# Patient Record
Sex: Female | Born: 1950 | Race: White | Hispanic: No | Marital: Married | Smoking: Never smoker
Health system: Southern US, Community
[De-identification: ages and names within clinical notes are randomized; demographics above are authoritative.]

## PROBLEM LIST (undated history)

## (undated) DIAGNOSIS — M5030 Other cervical disc degeneration, unspecified cervical region: Secondary | ICD-10-CM

## (undated) DIAGNOSIS — F419 Anxiety disorder, unspecified: Secondary | ICD-10-CM

## (undated) DIAGNOSIS — S0300XA Dislocation of jaw, unspecified side, initial encounter: Secondary | ICD-10-CM

## (undated) DIAGNOSIS — E7800 Pure hypercholesterolemia, unspecified: Secondary | ICD-10-CM

## (undated) DIAGNOSIS — I10 Essential (primary) hypertension: Secondary | ICD-10-CM

## (undated) DIAGNOSIS — M5136 Other intervertebral disc degeneration, lumbar region: Secondary | ICD-10-CM

## (undated) DIAGNOSIS — R51 Headache: Secondary | ICD-10-CM

## (undated) DIAGNOSIS — M51369 Other intervertebral disc degeneration, lumbar region without mention of lumbar back pain or lower extremity pain: Secondary | ICD-10-CM

## (undated) DIAGNOSIS — R519 Headache, unspecified: Secondary | ICD-10-CM

## (undated) DIAGNOSIS — M1990 Unspecified osteoarthritis, unspecified site: Secondary | ICD-10-CM

## (undated) DIAGNOSIS — F329 Major depressive disorder, single episode, unspecified: Secondary | ICD-10-CM

## (undated) DIAGNOSIS — M797 Fibromyalgia: Secondary | ICD-10-CM

## (undated) DIAGNOSIS — I499 Cardiac arrhythmia, unspecified: Secondary | ICD-10-CM

## (undated) DIAGNOSIS — F32A Depression, unspecified: Secondary | ICD-10-CM

## (undated) DIAGNOSIS — K219 Gastro-esophageal reflux disease without esophagitis: Secondary | ICD-10-CM

## (undated) HISTORY — PX: OTHER SURGICAL HISTORY: SHX169

## (undated) HISTORY — PX: BACK SURGERY: SHX140

## (undated) HISTORY — PX: TONSILLECTOMY: SUR1361

## (undated) HISTORY — PX: KNEE ARTHROSCOPY: SUR90

## (undated) HISTORY — PX: ROTATOR CUFF REPAIR: SHX139

## (undated) HISTORY — PX: EYE SURGERY: SHX253

## (undated) HISTORY — PX: BUNIONECTOMY: SHX129

## (undated) HISTORY — PX: TUBAL LIGATION: SHX77

---

## 1997-12-02 ENCOUNTER — Ambulatory Visit (HOSPITAL_COMMUNITY): Admission: RE | Admit: 1997-12-02 | Discharge: 1997-12-03 | Payer: Self-pay | Admitting: Orthopaedic Surgery

## 1997-12-05 ENCOUNTER — Inpatient Hospital Stay (HOSPITAL_COMMUNITY): Admission: RE | Admit: 1997-12-05 | Discharge: 1997-12-06 | Payer: Self-pay | Admitting: Orthopaedic Surgery

## 1999-03-08 ENCOUNTER — Other Ambulatory Visit: Admission: RE | Admit: 1999-03-08 | Discharge: 1999-03-08 | Payer: Self-pay | Admitting: Obstetrics & Gynecology

## 1999-06-09 ENCOUNTER — Ambulatory Visit (HOSPITAL_COMMUNITY): Admission: RE | Admit: 1999-06-09 | Discharge: 1999-06-09 | Payer: Self-pay | Admitting: Orthopedic Surgery

## 1999-08-24 ENCOUNTER — Encounter: Admission: RE | Admit: 1999-08-24 | Discharge: 1999-11-22 | Payer: Self-pay | Admitting: Anesthesiology

## 1999-10-12 ENCOUNTER — Encounter: Payer: Self-pay | Admitting: Family Medicine

## 1999-10-12 ENCOUNTER — Ambulatory Visit (HOSPITAL_COMMUNITY): Admission: RE | Admit: 1999-10-12 | Discharge: 1999-10-12 | Payer: Self-pay | Admitting: Family Medicine

## 1999-12-26 ENCOUNTER — Encounter: Payer: Self-pay | Admitting: *Deleted

## 1999-12-26 ENCOUNTER — Encounter: Admission: RE | Admit: 1999-12-26 | Discharge: 1999-12-26 | Payer: Self-pay | Admitting: *Deleted

## 2000-05-21 ENCOUNTER — Other Ambulatory Visit: Admission: RE | Admit: 2000-05-21 | Discharge: 2000-05-21 | Payer: Self-pay | Admitting: Obstetrics & Gynecology

## 2000-12-09 ENCOUNTER — Inpatient Hospital Stay (HOSPITAL_COMMUNITY): Admission: RE | Admit: 2000-12-09 | Discharge: 2000-12-13 | Payer: Self-pay | Admitting: Neurosurgery

## 2000-12-09 ENCOUNTER — Encounter: Payer: Self-pay | Admitting: Neurosurgery

## 2001-03-09 ENCOUNTER — Encounter: Payer: Self-pay | Admitting: Neurosurgery

## 2001-03-09 ENCOUNTER — Encounter: Admission: RE | Admit: 2001-03-09 | Discharge: 2001-03-09 | Payer: Self-pay | Admitting: Neurosurgery

## 2001-06-02 ENCOUNTER — Other Ambulatory Visit: Admission: RE | Admit: 2001-06-02 | Discharge: 2001-06-02 | Payer: Self-pay | Admitting: Obstetrics & Gynecology

## 2001-06-16 ENCOUNTER — Encounter: Payer: Self-pay | Admitting: Neurosurgery

## 2001-06-16 ENCOUNTER — Encounter: Admission: RE | Admit: 2001-06-16 | Discharge: 2001-06-16 | Payer: Self-pay | Admitting: Neurosurgery

## 2002-07-26 ENCOUNTER — Encounter: Admission: RE | Admit: 2002-07-26 | Discharge: 2002-07-26 | Payer: Self-pay | Admitting: *Deleted

## 2002-07-26 ENCOUNTER — Encounter: Payer: Self-pay | Admitting: *Deleted

## 2002-08-10 ENCOUNTER — Other Ambulatory Visit: Admission: RE | Admit: 2002-08-10 | Discharge: 2002-08-10 | Payer: Self-pay | Admitting: Obstetrics & Gynecology

## 2002-12-29 ENCOUNTER — Ambulatory Visit (HOSPITAL_COMMUNITY): Admission: RE | Admit: 2002-12-29 | Discharge: 2002-12-29 | Payer: Self-pay | Admitting: Gastroenterology

## 2003-10-11 ENCOUNTER — Other Ambulatory Visit: Admission: RE | Admit: 2003-10-11 | Discharge: 2003-10-11 | Payer: Self-pay | Admitting: Obstetrics & Gynecology

## 2004-11-08 ENCOUNTER — Other Ambulatory Visit: Admission: RE | Admit: 2004-11-08 | Discharge: 2004-11-08 | Payer: Self-pay | Admitting: Obstetrics & Gynecology

## 2005-10-03 ENCOUNTER — Encounter: Admission: RE | Admit: 2005-10-03 | Discharge: 2005-10-03 | Payer: Self-pay | Admitting: Obstetrics & Gynecology

## 2007-10-05 ENCOUNTER — Encounter: Admission: RE | Admit: 2007-10-05 | Discharge: 2007-10-05 | Payer: Self-pay | Admitting: Family Medicine

## 2008-06-29 ENCOUNTER — Encounter: Admission: RE | Admit: 2008-06-29 | Discharge: 2008-06-29 | Payer: Self-pay | Admitting: Otolaryngology

## 2008-10-10 ENCOUNTER — Ambulatory Visit (HOSPITAL_COMMUNITY): Admission: RE | Admit: 2008-10-10 | Discharge: 2008-10-11 | Payer: Self-pay | Admitting: Orthopaedic Surgery

## 2009-09-26 ENCOUNTER — Emergency Department (HOSPITAL_COMMUNITY): Admission: EM | Admit: 2009-09-26 | Discharge: 2009-09-26 | Payer: Self-pay | Admitting: Family Medicine

## 2009-10-05 ENCOUNTER — Encounter: Admission: RE | Admit: 2009-10-05 | Discharge: 2009-10-05 | Payer: Self-pay | Admitting: Family Medicine

## 2009-10-10 ENCOUNTER — Ambulatory Visit (HOSPITAL_COMMUNITY): Admission: RE | Admit: 2009-10-10 | Discharge: 2009-10-11 | Payer: Self-pay | Admitting: Neurosurgery

## 2010-07-09 LAB — BASIC METABOLIC PANEL
BUN: 8 mg/dL (ref 6–23)
CO2: 29 mEq/L (ref 19–32)
Chloride: 102 mEq/L (ref 96–112)
Creatinine, Ser: 0.94 mg/dL (ref 0.4–1.2)
GFR calc Af Amer: >60 mL/min (ref >60)
Potassium: 3.8 mEq/L (ref 3.5–5.1)
Sodium: 139 mEq/L (ref 135–145)

## 2010-07-09 LAB — SURGICAL PCR SCREEN
MRSA, PCR: NEGATIVE
Staphylococcus aureus: NEGATIVE

## 2010-07-09 LAB — CBC
Hemoglobin: 13.9 g/dL (ref 12.0–15.0)
Platelets: 272 10*3/uL (ref 150–400)
RBC: 4.42 MIL/uL (ref 3.87–5.11)

## 2010-07-30 LAB — CBC
HCT: 40.5 % (ref 36.0–46.0)
Hemoglobin: 13.6 g/dL (ref 12.0–15.0)
Platelets: 253 10*3/uL (ref 150–400)
RBC: 4.34 MIL/uL (ref 3.87–5.11)
WBC: 7.3 10*3/uL (ref 4.0–10.5)

## 2010-07-30 LAB — COMPREHENSIVE METABOLIC PANEL
AST: 32 U/L (ref 0–37)
Alkaline Phosphatase: 69 U/L (ref 39–117)
Creatinine, Ser: 0.78 mg/dL (ref 0.4–1.2)
Potassium: 4.1 mEq/L (ref 3.5–5.1)
Total Bilirubin: 0.6 mg/dL (ref 0.3–1.2)

## 2010-07-30 LAB — URINALYSIS, ROUTINE W REFLEX MICROSCOPIC
Bilirubin Urine: NEGATIVE
Ketones, ur: NEGATIVE mg/dL
Specific Gravity, Urine: 1.007 (ref 1.005–1.030)

## 2010-09-04 NOTE — Op Note (Signed)
NAMEMONAY, HOULTON             ACCOUNT NO.:  [id number]   MEDICAL RECORD NO.:  [id number]          PATIENT TYPE:  OIB   LOCATION:  5012                         FACILITY:  [facility]   PHYSICIAN:  Mark C. Ophelia Charter, M.D.    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   PROCEDURE:  C4-5 anterior cervical diskectomy and fusion, allograft, and  plate.   SURGEON:  Mark C. Ophelia Charter, MD   ASSISTANT:  Wende Neighbors, PA-C   ANESTHESIA:  GOT.   ESTIMATED BLOOD LOSS:  Minimal.   DRAINS:  One Hemovac neck.   BRIEF HISTORY:  This is a 60 year old female had a Cloward fusion at C5-  6 years ago which healed nicely.  She done well until recently.  She had  progressive increased neck pain, shoulder pain more in the left than  right with numbness in her shoulder.  The pain was progressed and MRI  scan shows significant spondylosis with biforaminal stenosis, worse in  the left than right, and central stenosis without cord abnormality.  The  patient was having no long-track signs.   PROCEDURE:  After induction of general anesthesia, orotracheal  intubation, head halter traction, arms tucked to the side with yellow  pads, neck was prepped with DuraPrep.  The area was squared with towels,  sterile skin marker was used in the old incision and Betadine Vi-drape  was applied.  Sterile Mayo stand at the head, thyroid sheets and drapes.  Old incision was opened after time-out checklist was completed.  Calf  pumpers and the patient warmer and Ancef prophylaxis were used.  Incision was made using the old incision.  Platysma was elevated,  cephalad and then dissection down to the prominent spur at C4-5.  Short  25 needle and drape, C-arm was brought in and confirmed that this was at  the C4-5 level above the old solid fusion.  Self-retaining Cloward  retractors were placed.  Teeth blades right and left smooth blades up  and down, diskectomy was performed with the scalpel, pituitary operative  microscope was draped and brought in and drilling with 4-mm bur  progressively back to the posterior cortex.  There was 0.5-mm gap  between the C4-C5 and spurs were present that were significant in both  right and left.  These had to be removed with microdissection using 1-mm  Kerrison and the Karlin curettes, 4-0.  Spurs were picked up, removed  and posterior longitudinal ligament was taken down until the dura was  well visualized right and left.  Uncovertebral spurs were removed and  gutters were stripped with the Cloward curettes.  Sizer showed 6-mm  graft with excellent fit.  Rasp was used.  Graft was rehydrated with  saline, marked at the midline and then impacted into good position.  It  was countersunk 1-2 mm, was stable.  A 16-mm VueLock Biomet EBI plate  was selected, 14-mm screws were used.  Position was checked initially  with the plate holder and then after placement of one screw and then  readjustment and placement of all four screws on final tube pictures.  Operative wound was dry.  Self-retaining  retractor was removed.  Hemovac was placed with in-and-out technique in  line with skin incision, 3-0 Vicryl in the platysma, 4-0 Vicryl  subcuticular closure.  Tincture of Benzoin, Steri-Strips, postop  dressing, soft cervical collar, and transferred to recovery room in  stable condition.  Instrument and needle count was correct.      Mark C. Ophelia Charter, M.D.  Electronically Signed     MCY/MEDQ  D:  10/10/2008  T:  10/11/2008  Job:  [id number]
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
## 2010-09-07 NOTE — Discharge Summary (Signed)
[place]. [facility]  Patient:    DEVONA, HOLMES Visit Number: [id number] MRN: [id number]          Service Type: SUR Location: 3000 3011 [id number] Attending Physician:  Colon Branch Dictated by:   Clydene Fake, M.D. Adm. Date:  12/09/2000 Disc. Date: 12/13/2000                             Discharge Summary  ADMISSION DIAGNOSES:  Degenerative disk disease, lateral recess stenosis, and herniated nucleus pulposus at L5-S1.  DISCHARGE DIAGNOSES:  Degenerative disk disease, lateral recess stenosis, and herniated nucleus pulposus at L5-S1.  PROCEDURES:  L5-S1 posterior lumbar interbody fusion with Brantigan interbody cages, pedicle screw fixation, decompressive laminectomy, autograft of same incision, and posterolateral fusion of L5-S1.  REASON FOR ADMISSION:  The patient is a 60 year old woman who has had severe back pain traditionally with more and more pain going down the left leg.  This has been going on for over six to eight months and pain worsening.  She had a discogram which recorded pain at L5-S1.  MRIs and x-rays showed severe degenerative disk disease at L5-S1 with a left-sided disk herniation and some lateral recess stenosis.  The patient was brought in for decompression and fusion.  HOSPITAL COURSE:  The patient was admitted on the day of surgery and underwent the procedure without complications.  Postoperatively the patient was transferred to the recovery room and then to the floor.  There she had some rash and itching to morphine which was changed to a Dilaudid PCA.  The next morning, she was little sedated with some slurred speech and decreased the Dilaudid PCA to a lower dose and that improved her mental status.  She was moving all extremities and did not have much leg pain.  She started to increase her activity on the first postoperative day.  PT and OT were evaluated for evaluation.  She had a little problem with nausea and  dizziness on the first day.  She was trying to get out of bed and essentially was only sitting up on that day.  By the next day, the nausea started improving, and we did work on increasing her activity.  On December 12, 2000, we got rid of the PCA and started her on oral pain medications.  She still had not had a bowel movement.  She had some abdominal distention, but overall was improving and was ambulating more.  She had decreased back pain and no leg pain.  She continued to improve.  On December 13, 2000, she will be discharged home in stable condition.  She was eating well and ambulating.  She has had a bowel movement.  The incision was clean, dry, and intact.  ACTIVITY:  She can be up with the brace on.  No strenuous activity.  WOUND CARE:  Keep the incision dry for five days.  DIET:  As tolerated.  FOLLOW-UP:  In three weeks in my office.  DISCHARGE MEDICATIONS:  The same as prehospitalization and Atenolol, Paxil, and Percocet p.r.n. for pain (51 were given), and Flexeril p.r.n. spasms (she already has that at home), but no nonsteroidal anti-inflammatories. Dictated by:   Clydene Fake, M.D. Attending Physician:  Colon Branch DD:  12/13/00 TD:  12/15/00 Job: [id number]

## 2010-09-07 NOTE — Procedures (Signed)
[facility]  Patient:    Stephanie Kim, Stephanie Kim                    MRN: [id number] Proc. Date: 09/14/99 Adm. Date:  [id number] Attending:  Thyra Breed CC:         Jearld Adjutant, M.D.                           Procedure Report  PROCEDURE:  Cervical epidural steroid injection.  DIAGNOSIS:  Cervical spondylosis.  INTERVAL HISTORY:  The patient has had no improvements after her injection into her lumbar region.  She continues to have a lot of neck discomfort radiating out to the right upper extremity.  Her lower back is no better.  Her medications are unchanged.  PHYSICAL EXAMINATION:  VITAL SIGNS:  Blood pressure 136/77, heart rate 52, respiratory rate 12, O2 saturation 98%, pain level is 8 out of 10, and temperature is 97.6.  MUSCULOSKELETAL/NEUROLOGIC:  Neck demonstrates good range of motion with pain on rotation in either direction.  Deep tendon reflexes in the upper extremities were 1-2+ and symmetric.  Motor was 5.5.  IMPRESSION: 1. Neck pain with history of cervical strain and underlying cervical    spondylosis. 2. Lumbar degenerative disk disease. 3. Other medical problems per Dr. Lattie Corns.  DISPOSITION:  I discussed the potential risk of the cervical epidural steroid injection in detail with the patient.  She wishes to proceed.  DESCRIPTION OF PROCEDURE:  After informed consent was obtained, the patient was placed in the right lateral decubitus position and monitored.  Her neck was prepped with Betadine x 3.  A skin wheal was raised at the C7-T1 interspace with 1% lidocaine.  A 20 gauge Tuohy needle was introduced into the cervical space and loss of resistance to preservative-free normal saline, a depth of 4 cm.  There was no CSF nor blood.  Medrol 40 mg in 3 cc of preservative-free normal saline was gently injected.  The needle was flushed with preservative-free normal saline and removed intact.  POSTPROCEDURE CONDITION:   Stable.  DISCHARGE INSTRUCTIONS: 1. Resume previous diet. 2. Limitation on activities per instruction sheet. 3. Continue current medications. 4. Follow up with me in six weeks.  The patient was encouraged to consider chiropractic or acupuncture therapy. DD:  09/14/99 TD:  09/19/99 Job: [id number]

## 2010-09-07 NOTE — Op Note (Signed)
[place]. [facility]  Patient:    Stephanie Kim, Stephanie Kim Visit Number: [id number] MRN: [id number]          Service Type: SUR Location: 3000 3011 [id number] Attending Physician:  Colon Branch Proc. Date: 12/09/00 Adm. Date:  12/09/2000                             Operative Report  PREOPERATIVE DIAGNOSES:  Degenerative disk disease L5-S1 with left-sided herniated nucleus pulposus and lateral stenosis.  POSTOPERATIVE DIAGNOSES:  Degenerative disk disease L5-S1 with left-sided herniated nucleus pulposus and lateral stenosis.  PROCEDURE:  L5-S1 posterior lumbar interbody fusion with ______ interbody cages at L5-S1, pedicle screw fixation L5-S1, decompressive laminectomy for stenosis, autograft same incision, posterior lateral fusion L5-1.  SURGEON:  Clydene Fake, M.D.  ASSISTANT:  Izell . Elesa Hacker, M.D.  ANESTHESIA:  General endotracheal tube anesthesia.  ESTIMATED BLOOD LOSS:  Less than 250 cc.  BLOOD GIVEN:  None.  DRAINS:  None.  COMPLICATIONS:  None.  REASON FOR CONSULTATION:  The patient is a 60 year old woman who has had severe back pain recently, and more and more left leg pain.  She has had a discogram with this concordant pain at L5-S1.  MRIs and x-rays that showed severe degenerative disk disease at the L5-S1 level with left-sided disk herniation and some lateral stenosis at that level.  Patient brought in for decompression and fusion.  PROCEDURE IN DETAIL:  The patient was brought to the operating room and general anesthesia was induced.  The patient was placed in a prone position and a Wilson Frame with all pressure points padded.  Incision was made in the lower lumbosacral spine.  Incision taken down to fascia and hemostasis obtained with Bovie cauterization.  The fascia was incised over the spinous process of L4-5 and S1.  Subperiosteal dissection was then done over the L4-5 spinous processes to the facets bilaterally.  Fluoroscopic  imaging was used to confirm positioning.  We dissected laterally over the 4-5 facet finding the 5 transverse process and out lateral to the 5-1 facet.  I did this bilaterally. Hemostasis was obtained with Bovie cauterization.  At this point decompressive laminectomy for the lateral stenosis was done at the L5-S1 level.  It was a semi-bilateral along with medial fasciectomies. All the bone was removed, cleaned, and chopped into small pieces and saved for use later in the case.  A high speed drill was used to assist in laminectomy and fasciectomy.  Epidural hemostasis was obtained with bipolar cauterization and disk space was found.  A large disk bulge and subligamentous disk herniation was seen, bilaterally worse on the left.  The disk space was incised and diskectomy performed with pituitary rongeurs.  We then used interbody distraction to distract the 5-1 interspace up to 9 mm. We then used the shavers and then broached to prepare the 5-1 disk space for the ______ cages bilaterally.  We used fluoroscopic imaging during the use of the broach and for the distraction.  A 9 x 9 x 21 mm cages were then packed with autograft and ______  bone mixture.  The same bone mixture was then placed anterior into the interspace and then a right ______ cage tapped into place while the interbody distraction was on the left side.  This distraction was then removed.  The interspace was packed with bone and then the left-sided cage was tapped into place.  Again, this was done with fluoroscopic  imaging to confirm the position of the cages.  It was irrigated with antibiotic solution.  We then found the entry point to the L5-S1 pedicles.  High-speed drill was used to decorticate the bone and then a pedicle probe was placed down into the pedicles using fluoroscopic imaging. After the probe was removed the hole was tapped and the pedicle screws were placed.  At L5 a 6 1/4 x 40 mm long ______ screws were used and  an S1 7 mm wide by 35 mm long ______ screws were used.  A 45 mm ______ was then placed into the tops of the pedicle screws, one on each side, and the locking nuts snapped on top of it.  We then tightened the tightening nuts on L5 and then with compression to the 5-1 interspace we tightened the S1 tightening nut, first on the left, then on the right.  We then checked each screw to make sure they were tightened down.  The wound was irrigated with antibiotic solution and then the rest of our allograft bone mixture was placed out in the posterior lateral gutter bilaterally for a posterolateral fusion.  Traction was removed and hemostasis was obtained with Bovie cauterization. Gelfoam was placed over the dura, and the paraspinous muscles and then the fascia were closed with 0 Vicryl interrupted sutures.  The subcutaneous tissue was then closed with 2-0 and 3-0 Vicryl interrupted sutures and the skin closed with Steri-Strips.  Dressing was placed. The patient was placed back in supine position and awakened from anesthesia and transferred to recovery room. Attending Physician:  Colon Branch DD:  12/09/00 TD:  12/10/00 Job: [id number]
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
## 2010-09-07 NOTE — Op Note (Signed)
   NAME:  Stephanie Kim, Stephanie Kim                       ACCOUNT NO.:  [id number]   MEDICAL RECORD NO.:  [id number]                   PATIENT TYPE:  AMB   LOCATION:  ENDO                                 FACILITY:  [facility]   PHYSICIAN:  Graylin Shiver, M.D.                DATE OF BIRTH:  11/16/1950   DATE OF PROCEDURE:  12/29/2002  DATE OF DISCHARGE:                                 OPERATIVE REPORT   PROCEDURE PERFORMED:  Colonoscopy.   INDICATIONS FOR PROCEDURE:  Screening.  Informed consent was obtained after  explanation of the risks of bleeding, infection, and perforation.   PREMEDICATIONS:  Fentanyl 60 mcg  IV, Versed 6 mg IV.   DESCRIPTION OF PROCEDURE:  With the patient in the left lateral decubitus  position, a rectal exam was performed and no masses were felt.  The Olympus  colonoscope was inserted into the rectum and advanced around the colon to  the cecum.  Cecal landmarks were identified.  The cecum and ascending colon  were normal.  The transverse colon normal.  The descending colon, sigmoid  and rectum were normal.  The patient tolerated the procedure well without  complications.   IMPRESSION:  Normal colonoscopy to the cecum.                                               Graylin Shiver, M.D.    Germain Osgood  D:  12/29/2002  T:  12/29/2002  Job:  [id number]   cc:   Tama Headings. Marina Goodell, M.D.  [address]  [place]  [place] [address]  Fax: [phone]

## 2011-09-24 ENCOUNTER — Other Ambulatory Visit: Payer: Self-pay | Admitting: Family Medicine

## 2011-09-24 DIAGNOSIS — M810 Age-related osteoporosis without current pathological fracture: Secondary | ICD-10-CM

## 2011-10-18 IMAGING — CR DG NECK SOFT TISSUE
1 series · 1 of 1 positions shown · non-contrast
Comparison: Intraoperative radiographs 10/10/2008.

CLINICAL DATA: Fell lodged in throat.

NECK SOFT TISSUES - 1+ VIEW

[view not recorded]
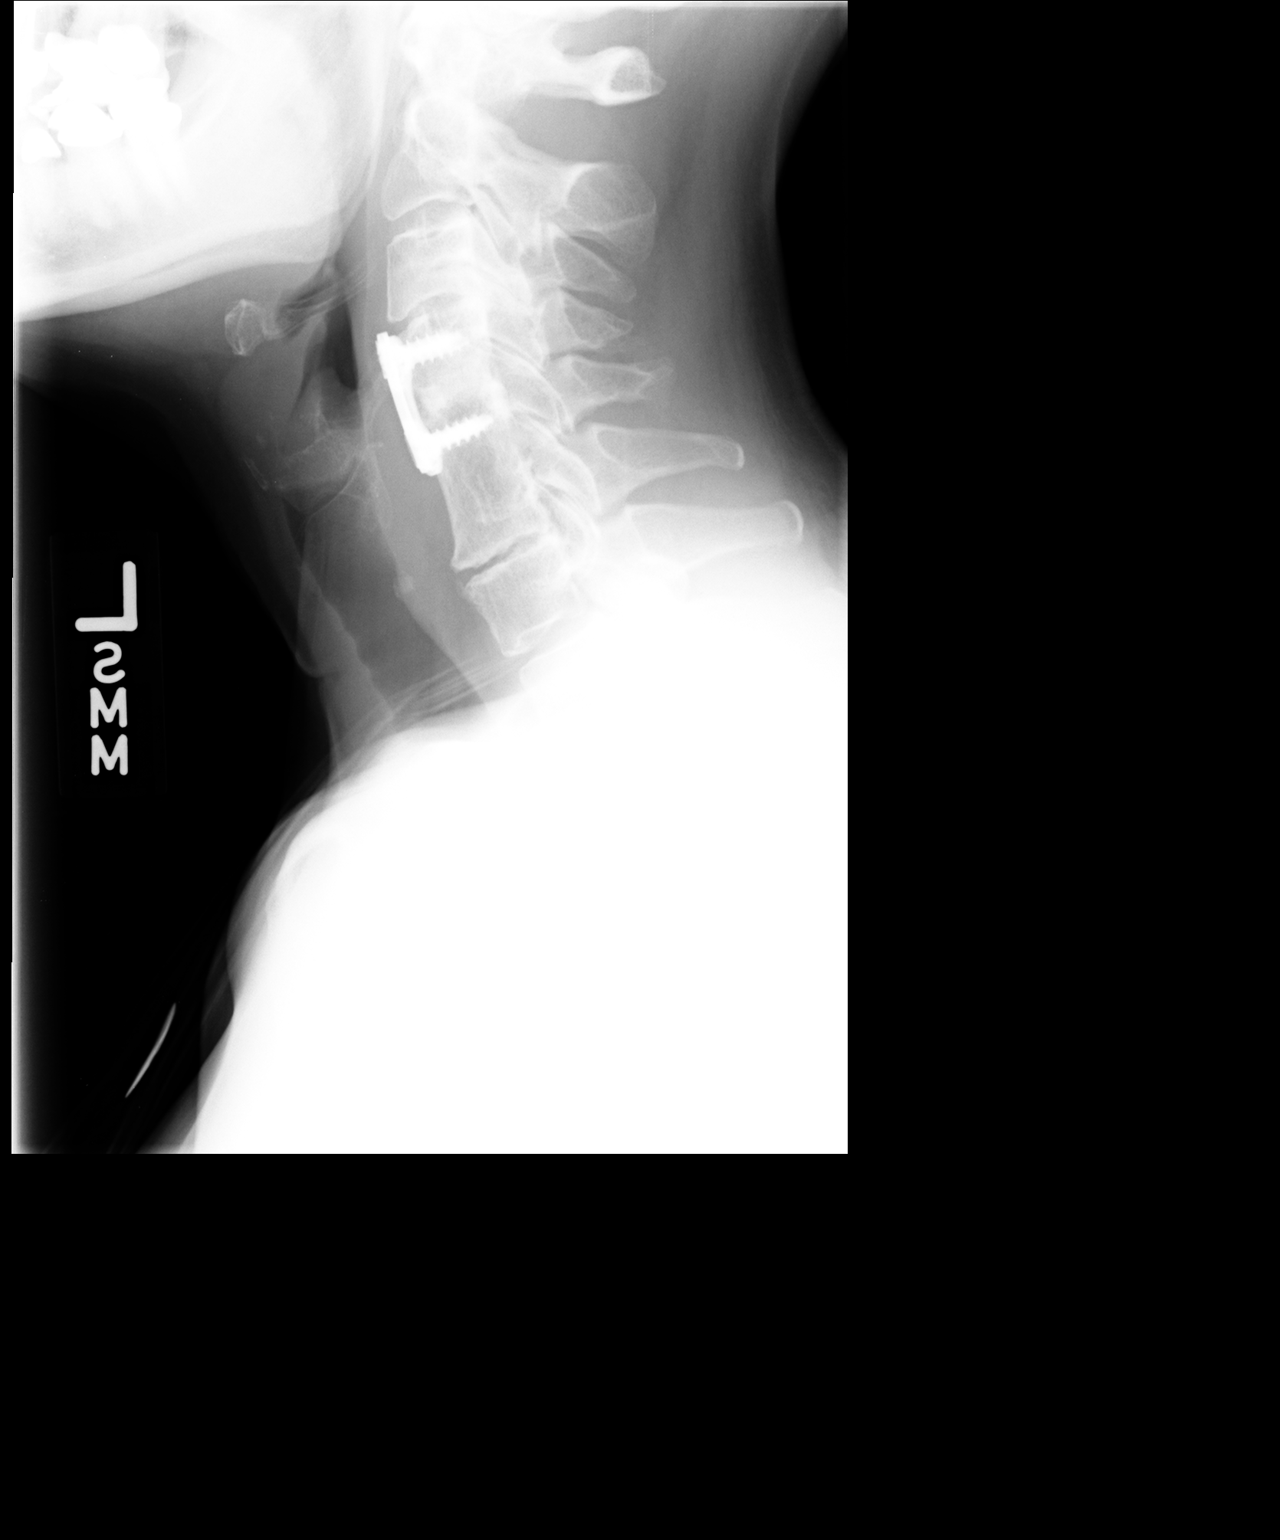

[1 of 1 positions shown; findings below may reference images not displayed]

FINDINGS: Patient is status post cervical fusion from C4-C6.
Anterior plate and screws at C4-C5 are intact.  There is
spondylosis at C6-C7.  The prevertebral soft tissues appear normal.
The epiglottis appears normal.  No foreign bodies are identified
within the airway or upper esophagus.
IMPRESSION: No demonstrated foreign bodies in the neck.  Prior cervical fusion.

## 2011-10-23 ENCOUNTER — Ambulatory Visit
Admission: RE | Admit: 2011-10-23 | Discharge: 2011-10-23 | Disposition: A | Discharge status: Home / Self Care | Payer: Self-pay | Source: Ambulatory Visit | Attending: Family Medicine | Admitting: Family Medicine

## 2011-10-23 DIAGNOSIS — M810 Age-related osteoporosis without current pathological fracture: Secondary | ICD-10-CM

## 2011-10-23 DIAGNOSIS — M8580 Other specified disorders of bone density and structure, unspecified site: Secondary | ICD-10-CM

## 2011-11-06 ENCOUNTER — Other Ambulatory Visit: Payer: Self-pay | Admitting: Neurosurgery

## 2011-11-06 DIAGNOSIS — M48061 Spinal stenosis, lumbar region without neurogenic claudication: Secondary | ICD-10-CM

## 2011-11-06 DIAGNOSIS — IMO0002 Reserved for concepts with insufficient information to code with codable children: Secondary | ICD-10-CM

## 2011-11-06 DIAGNOSIS — M545 Low back pain: Secondary | ICD-10-CM

## 2011-11-12 ENCOUNTER — Ambulatory Visit
Admission: RE | Admit: 2011-11-12 | Discharge: 2011-11-12 | Disposition: A | Discharge status: Home / Self Care | Payer: BC Managed Care – PPO | Source: Ambulatory Visit | Attending: Neurosurgery | Admitting: Neurosurgery

## 2011-11-12 DIAGNOSIS — M545 Low back pain: Secondary | ICD-10-CM

## 2011-11-12 DIAGNOSIS — IMO0002 Reserved for concepts with insufficient information to code with codable children: Secondary | ICD-10-CM

## 2011-11-12 DIAGNOSIS — M48061 Spinal stenosis, lumbar region without neurogenic claudication: Secondary | ICD-10-CM

## 2011-11-12 MED ORDER — GADOBENATE DIMEGLUMINE 529 MG/ML IV SOLN
11.0000 mL | Freq: Once | INTRAVENOUS | Status: AC | PRN
  Administered 2011-11-12: 11 mL via INTRAVENOUS

## 2013-10-26 ENCOUNTER — Other Ambulatory Visit: Payer: Self-pay | Admitting: Physician Assistant

## 2013-10-26 DIAGNOSIS — M810 Age-related osteoporosis without current pathological fracture: Secondary | ICD-10-CM

## 2013-10-28 ENCOUNTER — Other Ambulatory Visit: Payer: Self-pay | Admitting: Physician Assistant

## 2013-10-28 ENCOUNTER — Ambulatory Visit
Admission: RE | Admit: 2013-10-28 | Discharge: 2013-10-28 | Disposition: A | Discharge status: Home / Self Care | Payer: BC Managed Care – PPO | Source: Ambulatory Visit | Attending: Physician Assistant | Admitting: Physician Assistant

## 2013-10-28 DIAGNOSIS — M8580 Other specified disorders of bone density and structure, unspecified site: Secondary | ICD-10-CM

## 2013-10-28 DIAGNOSIS — M810 Age-related osteoporosis without current pathological fracture: Secondary | ICD-10-CM

## 2014-09-22 ENCOUNTER — Other Ambulatory Visit: Payer: Self-pay | Admitting: Neurosurgery

## 2014-10-18 ENCOUNTER — Encounter (HOSPITAL_COMMUNITY)
Admission: RE | Admit: 2014-10-18 | Discharge: 2014-10-18 | Disposition: A | Discharge status: Home / Self Care | Payer: BC Managed Care – PPO | Source: Ambulatory Visit | Attending: Neurosurgery | Admitting: Neurosurgery

## 2014-10-18 ENCOUNTER — Encounter (HOSPITAL_COMMUNITY): Payer: Self-pay

## 2014-10-18 DIAGNOSIS — Z01810 Encounter for preprocedural cardiovascular examination: Secondary | ICD-10-CM | POA: Insufficient documentation

## 2014-10-18 DIAGNOSIS — R002 Palpitations: Secondary | ICD-10-CM | POA: Diagnosis not present

## 2014-10-18 DIAGNOSIS — M4806 Spinal stenosis, lumbar region: Secondary | ICD-10-CM | POA: Diagnosis not present

## 2014-10-18 DIAGNOSIS — Z01812 Encounter for preprocedural laboratory examination: Secondary | ICD-10-CM | POA: Diagnosis present

## 2014-10-18 DIAGNOSIS — M5416 Radiculopathy, lumbar region: Secondary | ICD-10-CM | POA: Diagnosis not present

## 2014-10-18 DIAGNOSIS — Z0183 Encounter for blood typing: Secondary | ICD-10-CM | POA: Diagnosis not present

## 2014-10-18 DIAGNOSIS — M4316 Spondylolisthesis, lumbar region: Secondary | ICD-10-CM | POA: Insufficient documentation

## 2014-10-18 HISTORY — DX: Essential (primary) hypertension: I10

## 2014-10-18 HISTORY — DX: Depression, unspecified: F32.A

## 2014-10-18 HISTORY — DX: Anxiety disorder, unspecified: F41.9

## 2014-10-18 HISTORY — DX: Headache, unspecified: R51.9

## 2014-10-18 HISTORY — DX: Dislocation of jaw, unspecified side, initial encounter: S03.00XA

## 2014-10-18 HISTORY — DX: Cardiac arrhythmia, unspecified: I49.9

## 2014-10-18 HISTORY — DX: Pure hypercholesterolemia, unspecified: E78.00

## 2014-10-18 HISTORY — DX: Fibromyalgia: M79.7

## 2014-10-18 HISTORY — DX: Major depressive disorder, single episode, unspecified: F32.9

## 2014-10-18 HISTORY — DX: Headache: R51

## 2014-10-18 HISTORY — DX: Unspecified osteoarthritis, unspecified site: M19.90

## 2014-10-18 LAB — ABO/RH: ABO/RH(D): O POS

## 2014-10-18 LAB — CBC
HEMATOCRIT: 44 % (ref 36.0–46.0)
HEMOGLOBIN: 14.6 g/dL (ref 12.0–15.0)
MCH: 31 pg (ref 26.0–34.0)
MCHC: 33.2 g/dL (ref 30.0–36.0)
MCV: 93.4 fL (ref 78.0–100.0)
Platelets: 308 10*3/uL (ref 150–400)
RBC: 4.71 MIL/uL (ref 3.87–5.11)
RDW: 12.7 % (ref 11.5–15.5)
WBC: 9.4 10*3/uL (ref 4.0–10.5)

## 2014-10-18 LAB — BASIC METABOLIC PANEL
Anion gap: 10 (ref 5–15)
BUN: 14 mg/dL (ref 6–20)
CO2: 26 mmol/L (ref 22–32)
CREATININE: 0.93 mg/dL (ref 0.44–1.00)
Calcium: 9.4 mg/dL (ref 8.9–10.3)
Chloride: 101 mmol/L (ref 101–111)
GFR calc Af Amer: >60 mL/min (ref >60)
GFR calc non Af Amer: >60 mL/min (ref >60)
GLUCOSE: 89 mg/dL (ref 65–99)
Potassium: 4.2 mmol/L (ref 3.5–5.1)
Sodium: 137 mmol/L (ref 135–145)

## 2014-10-18 LAB — TYPE AND SCREEN
ABO/RH(D): O POS
ANTIBODY SCREEN: NEGATIVE

## 2014-10-18 LAB — SURGICAL PCR SCREEN
MRSA, PCR: NEGATIVE
Staphylococcus aureus: NEGATIVE

## 2014-10-18 NOTE — Pre-Procedure Instructions (Signed)
Stephanie Kim  10/18/2014      [facility] [id number] - [place],  - [address] [facility] [address] [place] [id number] Phone: [phone] Fax: [phone]    Your procedure is scheduled on July 5th, Tuesday   [facility] Admitting at 5:30 AM  Call this number if you have problems the morning of surgery:  [phone]   Remember:  Do not eat food or drink liquids after midnight Monday.  Take these medicines the morning of surgery with A SIP OF WATER : Wellbutrin, metoprolol, zoloft.  Please use your nasal spray the morning of surgery.   Do not wear jewelry, make-up or nail polish.  Do not wear lotions, powders, or perfumes.  You may NOT wear deodorant the day of surgery.  Do not shave underarms & legs 48 hours prior to surgery.     Do not bring valuables to the hospital.  [facility] is not responsible for any belongings or valuables. Contacts, dentures or bridgework may not be worn into surgery.  Leave your suitcase in the car.  After surgery it may be brought to your room. For patients admitted to the hospital, discharge time will be determined by your treatment team.  Name and phone number of your driver:     Special instructions:  "Preparing for Surgery" instruction sheet.  Please read over the following fact sheets that you were given. Pain Booklet, Blood Transfusion Information, MRSA Information and Surgical Site Infection Prevention

## 2014-10-18 NOTE — Progress Notes (Signed)
PCP is Dr. Cyndia BentBadger, LOV 'acouple of months ago'.  Also see Huntley DecSara C-Spencer PA @ [facility]  [phone]. States she does have 'some palpitations--was put on Toprol for it.  No cardiac tests were done.  She denies any chest discomfort or problems at this time. Wants clarification of wording on consent.  She says that Lumbar 6-7 should be included.  I have called the office and left VM for Jessica Hanks.  (11:57 on Tuesday)

## 2014-10-24 MED ORDER — CEFAZOLIN SODIUM-DEXTROSE 2-3 GM-% IV SOLR
2.0000 g | INTRAVENOUS | Status: AC
  Administered 2014-10-25: 2 g via INTRAVENOUS
  Filled 2014-10-24: qty 50

## 2014-10-24 MED ORDER — CEFAZOLIN SODIUM-DEXTROSE 2-3 GM-% IV SOLR: 2.0000 g | INTRAVENOUS | Status: DC

## 2014-10-25 ENCOUNTER — Inpatient Hospital Stay (HOSPITAL_COMMUNITY): Payer: BC Managed Care – PPO

## 2014-10-25 ENCOUNTER — Encounter (HOSPITAL_COMMUNITY): Payer: Self-pay | Admitting: *Deleted

## 2014-10-25 ENCOUNTER — Inpatient Hospital Stay (HOSPITAL_COMMUNITY): Payer: BC Managed Care – PPO | Admitting: Certified Registered Nurse Anesthetist

## 2014-10-25 ENCOUNTER — Inpatient Hospital Stay (HOSPITAL_COMMUNITY)
Admission: RE | Admit: 2014-10-25 | Discharge: 2014-10-28 | DRG: 460 | Disposition: A | Discharge status: Home Health | Payer: BC Managed Care – PPO | Source: Ambulatory Visit | Attending: Neurosurgery | Admitting: Neurosurgery

## 2014-10-25 ENCOUNTER — Encounter (HOSPITAL_COMMUNITY)
Admission: RE | Disposition: A | Discharge status: Home Health | Payer: BC Managed Care – PPO | Source: Ambulatory Visit | Attending: Neurosurgery

## 2014-10-25 DIAGNOSIS — M5416 Radiculopathy, lumbar region: Secondary | ICD-10-CM | POA: Diagnosis present

## 2014-10-25 DIAGNOSIS — Z886 Allergy status to analgesic agent status: Secondary | ICD-10-CM

## 2014-10-25 DIAGNOSIS — F329 Major depressive disorder, single episode, unspecified: Secondary | ICD-10-CM | POA: Diagnosis present

## 2014-10-25 DIAGNOSIS — M4316 Spondylolisthesis, lumbar region: Secondary | ICD-10-CM | POA: Diagnosis present

## 2014-10-25 DIAGNOSIS — M4806 Spinal stenosis, lumbar region: Principal | ICD-10-CM | POA: Diagnosis present

## 2014-10-25 DIAGNOSIS — I10 Essential (primary) hypertension: Secondary | ICD-10-CM | POA: Diagnosis present

## 2014-10-25 DIAGNOSIS — Z419 Encounter for procedure for purposes other than remedying health state, unspecified: Secondary | ICD-10-CM

## 2014-10-25 DIAGNOSIS — M545 Low back pain: Secondary | ICD-10-CM | POA: Diagnosis present

## 2014-10-25 DIAGNOSIS — M129 Arthropathy, unspecified: Secondary | ICD-10-CM | POA: Diagnosis present

## 2014-10-25 SURGERY — POSTERIOR LUMBAR FUSION 2 WITH HARDWARE REMOVAL
Anesthesia: General | Site: Back

## 2014-10-25 MED ORDER — BUPIVACAINE LIPOSOME 1.3 % IJ SUSP
20.0000 mL | Freq: Once | INTRAMUSCULAR | Status: DC
  Filled 2014-10-25: qty 20

## 2014-10-25 MED ORDER — DEXTROSE 5 % IV SOLN
500.0000 mg | Freq: Four times a day (QID) | INTRAVENOUS | Status: DC | PRN
  Filled 2014-10-25: qty 5

## 2014-10-25 MED ORDER — HYDROMORPHONE HCL 1 MG/ML IJ SOLN
0.5000 mg | INTRAMUSCULAR | Status: DC | PRN
  Administered 2014-10-25 – 2014-10-28 (×7): 1 mg via INTRAVENOUS
  Filled 2014-10-25 (×7): qty 1

## 2014-10-25 MED ORDER — 0.9 % SODIUM CHLORIDE (POUR BTL) OPTIME
TOPICAL | Status: DC | PRN
  Administered 2014-10-25: 1000 mL

## 2014-10-25 MED ORDER — DIPHENHYDRAMINE HCL 50 MG/ML IJ SOLN
INTRAMUSCULAR | Status: DC | PRN
  Administered 2014-10-25: 25 mg via INTRAVENOUS

## 2014-10-25 MED ORDER — BUPROPION HCL ER (XL) 150 MG PO TB24
150.0000 mg | ORAL_TABLET | Freq: Every day | ORAL | Status: DC
  Administered 2014-10-26 – 2014-10-28 (×3): 150 mg via ORAL
  Filled 2014-10-25 (×4): qty 1

## 2014-10-25 MED ORDER — LIDOCAINE HCL (CARDIAC) 20 MG/ML IV SOLN
INTRAVENOUS | Status: AC
  Filled 2014-10-25: qty 5

## 2014-10-25 MED ORDER — MIDAZOLAM HCL 5 MG/5ML IJ SOLN
INTRAMUSCULAR | Status: DC | PRN
  Administered 2014-10-25: 2 mg via INTRAVENOUS

## 2014-10-25 MED ORDER — MIDAZOLAM HCL 2 MG/2ML IJ SOLN
INTRAMUSCULAR | Status: AC
  Filled 2014-10-25: qty 2

## 2014-10-25 MED ORDER — METOPROLOL SUCCINATE ER 25 MG PO TB24
25.0000 mg | ORAL_TABLET | Freq: Every day | ORAL | Status: DC
  Administered 2014-10-27 – 2014-10-28 (×2): 25 mg via ORAL
  Filled 2014-10-25 (×2): qty 1

## 2014-10-25 MED ORDER — METHOCARBAMOL 500 MG PO TABS
500.0000 mg | ORAL_TABLET | Freq: Four times a day (QID) | ORAL | Status: DC | PRN
  Administered 2014-10-25 – 2014-10-28 (×4): 500 mg via ORAL
  Filled 2014-10-25 (×6): qty 1

## 2014-10-25 MED ORDER — PHENOL 1.4 % MT LIQD: 1.0000 | OROMUCOSAL | Status: DC | PRN

## 2014-10-25 MED ORDER — DEXAMETHASONE SODIUM PHOSPHATE 10 MG/ML IJ SOLN
INTRAMUSCULAR | Status: DC | PRN
  Administered 2014-10-25: 10 mg via INTRAVENOUS

## 2014-10-25 MED ORDER — SERTRALINE HCL 100 MG PO TABS
100.0000 mg | ORAL_TABLET | Freq: Every day | ORAL | Status: DC
  Administered 2014-10-26 – 2014-10-28 (×3): 100 mg via ORAL
  Filled 2014-10-25 (×4): qty 1

## 2014-10-25 MED ORDER — ACETAMINOPHEN 650 MG RE SUPP: 650.0000 mg | RECTAL | Status: DC | PRN

## 2014-10-25 MED ORDER — THROMBIN 20000 UNITS EX SOLR
CUTANEOUS | Status: DC | PRN
  Administered 2014-10-25: 20 mL via TOPICAL

## 2014-10-25 MED ORDER — EPHEDRINE SULFATE 50 MG/ML IJ SOLN
INTRAMUSCULAR | Status: DC | PRN
  Administered 2014-10-25 (×2): 10 mg via INTRAVENOUS
  Administered 2014-10-25 (×2): 5 mg via INTRAVENOUS

## 2014-10-25 MED ORDER — NEOSTIGMINE METHYLSULFATE 10 MG/10ML IV SOLN
INTRAVENOUS | Status: DC | PRN
  Administered 2014-10-25: 4 mg via INTRAVENOUS

## 2014-10-25 MED ORDER — DOCUSATE SODIUM 100 MG PO CAPS
100.0000 mg | ORAL_CAPSULE | Freq: Two times a day (BID) | ORAL | Status: DC
  Administered 2014-10-25 – 2014-10-28 (×7): 100 mg via ORAL
  Filled 2014-10-25 (×7): qty 1

## 2014-10-25 MED ORDER — ARTIFICIAL TEARS OP OINT
TOPICAL_OINTMENT | OPHTHALMIC | Status: AC
Start: 2014-10-25 — End: 2014-10-25
  Filled 2014-10-25: qty 3.5

## 2014-10-25 MED ORDER — HYDROCODONE-ACETAMINOPHEN 5-325 MG PO TABS
ORAL_TABLET | ORAL | Status: AC
  Administered 2014-10-25: 2 via ORAL
  Filled 2014-10-25: qty 2

## 2014-10-25 MED ORDER — PHENYLEPHRINE 40 MCG/ML (10ML) SYRINGE FOR IV PUSH (FOR BLOOD PRESSURE SUPPORT)
PREFILLED_SYRINGE | INTRAVENOUS | Status: AC
Start: 2014-10-25 — End: 2014-10-25
  Filled 2014-10-25: qty 10

## 2014-10-25 MED ORDER — VECURONIUM BROMIDE 10 MG IV SOLR
INTRAVENOUS | Status: DC | PRN
  Administered 2014-10-25: 1 mg via INTRAVENOUS
  Administered 2014-10-25: 2 mg via INTRAVENOUS

## 2014-10-25 MED ORDER — STERILE WATER FOR INJECTION IJ SOLN
INTRAMUSCULAR | Status: AC
  Filled 2014-10-25: qty 20

## 2014-10-25 MED ORDER — ARTIFICIAL TEARS OP OINT
TOPICAL_OINTMENT | OPHTHALMIC | Status: DC | PRN
Start: 2014-10-25 — End: 2014-10-25
  Administered 2014-10-25: 1 via OPHTHALMIC

## 2014-10-25 MED ORDER — METHOCARBAMOL 500 MG PO TABS
ORAL_TABLET | ORAL | Status: AC
  Administered 2014-10-25: 500 mg via ORAL
  Filled 2014-10-25: qty 1

## 2014-10-25 MED ORDER — FENTANYL CITRATE (PF) 250 MCG/5ML IJ SOLN
INTRAMUSCULAR | Status: AC
Start: 2014-10-25 — End: 2014-10-25
  Filled 2014-10-25: qty 5

## 2014-10-25 MED ORDER — LIDOCAINE-EPINEPHRINE 1 %-1:100000 IJ SOLN
INTRAMUSCULAR | Status: DC | PRN
  Administered 2014-10-25: 7 mL

## 2014-10-25 MED ORDER — ONDANSETRON HCL 4 MG/2ML IJ SOLN
INTRAMUSCULAR | Status: DC | PRN
  Administered 2014-10-25: 4 mg via INTRAVENOUS

## 2014-10-25 MED ORDER — GLYCOPYRROLATE 0.2 MG/ML IJ SOLN
INTRAMUSCULAR | Status: DC | PRN
  Administered 2014-10-25: .6 mg via INTRAVENOUS

## 2014-10-25 MED ORDER — HYDROMORPHONE HCL 1 MG/ML IJ SOLN
INTRAMUSCULAR | Status: AC
  Administered 2014-10-25: 0.5 mg via INTRAVENOUS
  Filled 2014-10-25: qty 2

## 2014-10-25 MED ORDER — ATORVASTATIN CALCIUM 10 MG PO TABS
10.0000 mg | ORAL_TABLET | Freq: Every day | ORAL | Status: DC
  Administered 2014-10-25 – 2014-10-28 (×4): 10 mg via ORAL
  Filled 2014-10-25 (×4): qty 1

## 2014-10-25 MED ORDER — DEXAMETHASONE SODIUM PHOSPHATE 10 MG/ML IJ SOLN
INTRAMUSCULAR | Status: AC
  Filled 2014-10-25: qty 1

## 2014-10-25 MED ORDER — ONDANSETRON HCL 4 MG/2ML IJ SOLN
INTRAMUSCULAR | Status: AC
Start: 2014-10-25 — End: 2014-10-25
  Filled 2014-10-25: qty 2

## 2014-10-25 MED ORDER — LIDOCAINE HCL (CARDIAC) 20 MG/ML IV SOLN
INTRAVENOUS | Status: DC | PRN
  Administered 2014-10-25: 50 mg via INTRAVENOUS

## 2014-10-25 MED ORDER — PROPOFOL 10 MG/ML IV BOLUS
INTRAVENOUS | Status: AC
  Filled 2014-10-25: qty 20

## 2014-10-25 MED ORDER — BUPIVACAINE LIPOSOME 1.3 % IJ SUSP
INTRAMUSCULAR | Status: DC | PRN
  Administered 2014-10-25: 20 mL

## 2014-10-25 MED ORDER — ROCURONIUM BROMIDE 100 MG/10ML IV SOLN
INTRAVENOUS | Status: DC | PRN
  Administered 2014-10-25: 50 mg via INTRAVENOUS

## 2014-10-25 MED ORDER — MIDAZOLAM HCL 2 MG/2ML IJ SOLN: 0.5000 mg | Freq: Once | INTRAMUSCULAR | Status: DC | PRN

## 2014-10-25 MED ORDER — ACETAMINOPHEN 325 MG PO TABS
650.0000 mg | ORAL_TABLET | ORAL | Status: DC | PRN
  Administered 2014-10-26 – 2014-10-27 (×3): 650 mg via ORAL
  Filled 2014-10-25 (×3): qty 2

## 2014-10-25 MED ORDER — DEXTROSE 5 % IV SOLN
10.0000 mg | INTRAVENOUS | Status: DC | PRN
  Administered 2014-10-25: 10 ug/min via INTRAVENOUS

## 2014-10-25 MED ORDER — BUPIVACAINE HCL (PF) 0.5 % IJ SOLN
INTRAMUSCULAR | Status: DC | PRN
  Administered 2014-10-25: 7 mL

## 2014-10-25 MED ORDER — ONDANSETRON HCL 4 MG/2ML IJ SOLN: 4.0000 mg | INTRAMUSCULAR | Status: DC | PRN

## 2014-10-25 MED ORDER — EPHEDRINE SULFATE 50 MG/ML IJ SOLN
INTRAMUSCULAR | Status: AC
  Filled 2014-10-25: qty 1

## 2014-10-25 MED ORDER — FENTANYL CITRATE (PF) 100 MCG/2ML IJ SOLN
INTRAMUSCULAR | Status: DC | PRN
  Administered 2014-10-25: 250 ug via INTRAVENOUS
  Administered 2014-10-25 (×3): 50 ug via INTRAVENOUS

## 2014-10-25 MED ORDER — SODIUM CHLORIDE 0.9 % IJ SOLN
3.0000 mL | Freq: Two times a day (BID) | INTRAMUSCULAR | Status: DC
  Administered 2014-10-25 – 2014-10-27 (×5): 3 mL via INTRAVENOUS

## 2014-10-25 MED ORDER — DIPHENHYDRAMINE HCL 25 MG PO CAPS
25.0000 mg | ORAL_CAPSULE | ORAL | Status: DC | PRN
  Administered 2014-10-26 – 2014-10-28 (×9): 25 mg via ORAL
  Filled 2014-10-25 (×9): qty 1

## 2014-10-25 MED ORDER — ALUM & MAG HYDROXIDE-SIMETH 200-200-20 MG/5ML PO SUSP
30.0000 mL | Freq: Four times a day (QID) | ORAL | Status: DC | PRN
  Administered 2014-10-26: 30 mL via ORAL
  Filled 2014-10-25: qty 30

## 2014-10-25 MED ORDER — SODIUM CHLORIDE 0.9 % IV SOLN
250.0000 mL | INTRAVENOUS | Status: DC
Start: 2014-10-25 — End: 2014-10-28
  Administered 2014-10-25 – 2014-10-27 (×2): 250 mL via INTRAVENOUS

## 2014-10-25 MED ORDER — VECURONIUM BROMIDE 10 MG IV SOLR
INTRAVENOUS | Status: AC
  Filled 2014-10-25: qty 10

## 2014-10-25 MED ORDER — HYDROCODONE-ACETAMINOPHEN 5-325 MG PO TABS
1.0000 | ORAL_TABLET | ORAL | Status: DC | PRN
  Administered 2014-10-25 – 2014-10-28 (×11): 2 via ORAL
  Filled 2014-10-25 (×10): qty 2

## 2014-10-25 MED ORDER — NEOSTIGMINE METHYLSULFATE 10 MG/10ML IV SOLN
INTRAVENOUS | Status: AC
  Filled 2014-10-25: qty 1

## 2014-10-25 MED ORDER — FLEET ENEMA 7-19 GM/118ML RE ENEM: 1.0000 | ENEMA | Freq: Once | RECTAL | Status: AC | PRN

## 2014-10-25 MED ORDER — CEFAZOLIN SODIUM 1-5 GM-% IV SOLN
1.0000 g | Freq: Three times a day (TID) | INTRAVENOUS | Status: AC
  Administered 2014-10-25 – 2014-10-26 (×2): 1 g via INTRAVENOUS
  Filled 2014-10-25 (×2): qty 50

## 2014-10-25 MED ORDER — HYDROMORPHONE HCL 1 MG/ML IJ SOLN
0.2500 mg | INTRAMUSCULAR | Status: DC | PRN
  Administered 2014-10-25: 0.5 mg via INTRAVENOUS

## 2014-10-25 MED ORDER — POLYETHYLENE GLYCOL 3350 17 G PO PACK: 17.0000 g | PACK | Freq: Every day | ORAL | Status: DC | PRN

## 2014-10-25 MED ORDER — FLUTICASONE PROPIONATE 50 MCG/ACT NA SUSP
2.0000 | Freq: Every day | NASAL | Status: DC
  Administered 2014-10-26 – 2014-10-28 (×3): 2 via NASAL
  Filled 2014-10-25: qty 16

## 2014-10-25 MED ORDER — PROPOFOL 10 MG/ML IV BOLUS
INTRAVENOUS | Status: DC | PRN
  Administered 2014-10-25: 100 mg via INTRAVENOUS

## 2014-10-25 MED ORDER — LISINOPRIL 20 MG PO TABS
20.0000 mg | ORAL_TABLET | Freq: Every day | ORAL | Status: DC
  Administered 2014-10-25 – 2014-10-28 (×3): 20 mg via ORAL
  Filled 2014-10-25 (×3): qty 1

## 2014-10-25 MED ORDER — ROCURONIUM BROMIDE 50 MG/5ML IV SOLN
INTRAVENOUS | Status: AC
  Filled 2014-10-25: qty 1

## 2014-10-25 MED ORDER — PROMETHAZINE HCL 25 MG/ML IJ SOLN: 6.2500 mg | INTRAMUSCULAR | Status: DC | PRN

## 2014-10-25 MED ORDER — MEPERIDINE HCL 25 MG/ML IJ SOLN: 6.2500 mg | INTRAMUSCULAR | Status: DC | PRN

## 2014-10-25 MED ORDER — FENTANYL CITRATE (PF) 250 MCG/5ML IJ SOLN
INTRAMUSCULAR | Status: AC
  Filled 2014-10-25: qty 5

## 2014-10-25 MED ORDER — DIPHENHYDRAMINE HCL 50 MG/ML IJ SOLN
INTRAMUSCULAR | Status: AC
  Filled 2014-10-25: qty 1

## 2014-10-25 MED ORDER — BISACODYL 10 MG RE SUPP: 10.0000 mg | Freq: Every day | RECTAL | Status: DC | PRN

## 2014-10-25 MED ORDER — SODIUM CHLORIDE 0.9 % IJ SOLN: 3.0000 mL | INTRAMUSCULAR | Status: DC | PRN

## 2014-10-25 MED ORDER — KCL IN DEXTROSE-NACL 20-5-0.45 MEQ/L-%-% IV SOLN: INTRAVENOUS | Status: DC

## 2014-10-25 MED ORDER — LACTATED RINGERS IV SOLN
INTRAVENOUS | Status: DC | PRN
  Administered 2014-10-25 (×2): via INTRAVENOUS

## 2014-10-25 MED ORDER — GLYCOPYRROLATE 0.2 MG/ML IJ SOLN
INTRAMUSCULAR | Status: AC
  Filled 2014-10-25: qty 3

## 2014-10-25 MED ORDER — MENTHOL 3 MG MT LOZG: 1.0000 | LOZENGE | OROMUCOSAL | Status: DC | PRN

## 2014-10-25 MED ORDER — PANTOPRAZOLE SODIUM 40 MG IV SOLR
40.0000 mg | Freq: Every day | INTRAVENOUS | Status: DC
  Administered 2014-10-25: 40 mg via INTRAVENOUS
  Filled 2014-10-25: qty 40

## 2014-10-25 MED ORDER — ACETAMINOPHEN 325 MG PO TABS: 650.0000 mg | ORAL_TABLET | Freq: Four times a day (QID) | ORAL | Status: DC | PRN

## 2014-10-25 SURGICAL SUPPLY — 82 items
ADH SKN CLS APL DERMABOND .7 (GAUZE/BANDAGES/DRESSINGS)
ADH SKN CLS LQ APL DERMABOND (GAUZE/BANDAGES/DRESSINGS) ×1
APL SKNCLS STERI-STRIP NONHPOA (GAUZE/BANDAGES/DRESSINGS)
BENZOIN TINCTURE PRP APPL 2/3 (GAUZE/BANDAGES/DRESSINGS) IMPLANT
BLADE CLIPPER SURG (BLADE) IMPLANT
BONE MATRIX OSTEOCEL PRO MED (Bone Implant) ×2 IMPLANT
BUR MATCHSTICK NEURO 3.0 LAGG (BURR) ×2 IMPLANT
BUR PRECISION FLUTE 5.0 (BURR) ×2 IMPLANT
CAGE COROENT MP 8X9X23M-8 SPIN (Cage) ×4 IMPLANT
CANISTER SUCT 3000ML PPV (MISCELLANEOUS) ×2 IMPLANT
CONT SPEC 4OZ CLIKSEAL STRL BL (MISCELLANEOUS) ×3 IMPLANT
COVER BACK TABLE 24X17X13 BIG (DRAPES) IMPLANT
COVER BACK TABLE 60X90IN (DRAPES) ×2 IMPLANT
DECANTER SPIKE VIAL GLASS SM (MISCELLANEOUS) ×2 IMPLANT
DERMABOND ADHESIVE PROPEN (GAUZE/BANDAGES/DRESSINGS) ×1
DERMABOND ADVANCED (GAUZE/BANDAGES/DRESSINGS)
DERMABOND ADVANCED .7 DNX12 (GAUZE/BANDAGES/DRESSINGS) ×1 IMPLANT
DERMABOND ADVANCED .7 DNX6 (GAUZE/BANDAGES/DRESSINGS) IMPLANT
DRAPE C-ARM 42X72 X-RAY (DRAPES) ×3 IMPLANT
DRAPE LAPAROTOMY 100X72X124 (DRAPES) ×2 IMPLANT
DRAPE POUCH INSTRU U-SHP 10X18 (DRAPES) ×2 IMPLANT
DRAPE SURG 17X23 STRL (DRAPES) ×2 IMPLANT
DRSG OPSITE POSTOP 4X8 (GAUZE/BANDAGES/DRESSINGS) ×1 IMPLANT
DRSG TELFA 3X8 NADH (GAUZE/BANDAGES/DRESSINGS) IMPLANT
DURAPREP 26ML APPLICATOR (WOUND CARE) ×2 IMPLANT
ELECT REM PT RETURN 9FT ADLT (ELECTROSURGICAL) ×2
ELECTRODE REM PT RTRN 9FT ADLT (ELECTROSURGICAL) ×1 IMPLANT
EVACUATOR 1/8 PVC DRAIN (DRAIN) ×1 IMPLANT
GAUZE SPONGE 4X4 12PLY STRL (GAUZE/BANDAGES/DRESSINGS) ×1 IMPLANT
GAUZE SPONGE 4X4 16PLY XRAY LF (GAUZE/BANDAGES/DRESSINGS) IMPLANT
GLOVE BIO SURGEON STRL SZ 6.5 (GLOVE) ×1 IMPLANT
GLOVE BIO SURGEON STRL SZ8 (GLOVE) ×4 IMPLANT
GLOVE BIOGEL PI IND STRL 6.5 (GLOVE) IMPLANT
GLOVE BIOGEL PI IND STRL 8 (GLOVE) ×2 IMPLANT
GLOVE BIOGEL PI IND STRL 8.5 (GLOVE) ×2 IMPLANT
GLOVE BIOGEL PI INDICATOR 6.5 (GLOVE) ×1
GLOVE BIOGEL PI INDICATOR 8 (GLOVE)
GLOVE BIOGEL PI INDICATOR 8.5 (GLOVE) ×2
GLOVE ECLIPSE 8.0 STRL XLNG CF (GLOVE) ×2 IMPLANT
GOWN STRL REUS W/ TWL LRG LVL3 (GOWN DISPOSABLE) IMPLANT
GOWN STRL REUS W/ TWL XL LVL3 (GOWN DISPOSABLE) ×3 IMPLANT
GOWN STRL REUS W/TWL 2XL LVL3 (GOWN DISPOSABLE) ×2 IMPLANT
GOWN STRL REUS W/TWL LRG LVL3 (GOWN DISPOSABLE) ×4
GOWN STRL REUS W/TWL XL LVL3 (GOWN DISPOSABLE) ×6
KIT BASIN OR (CUSTOM PROCEDURE TRAY) ×2 IMPLANT
KIT POSITION SURG JACKSON T1 (MISCELLANEOUS) ×2 IMPLANT
KIT ROOM TURNOVER OR (KITS) ×2 IMPLANT
MILL MEDIUM DISP (BLADE) ×2 IMPLANT
NDL HYPO 21X1.5 SAFETY (NEEDLE) IMPLANT
NDL HYPO 25X1 1.5 SAFETY (NEEDLE) ×1 IMPLANT
NDL SPNL 18GX3.5 QUINCKE PK (NEEDLE) IMPLANT
NEEDLE HYPO 21X1.5 SAFETY (NEEDLE) ×2 IMPLANT
NEEDLE HYPO 25X1 1.5 SAFETY (NEEDLE) ×2 IMPLANT
NEEDLE SPNL 18GX3.5 QUINCKE PK (NEEDLE) IMPLANT
NS IRRIG 1000ML POUR BTL (IV SOLUTION) ×2 IMPLANT
PACK LAMINECTOMY NEURO (CUSTOM PROCEDURE TRAY) ×2 IMPLANT
PAD ARMBOARD 7.5X6 YLW CONV (MISCELLANEOUS) ×6 IMPLANT
PAD DRESSING TELFA 3X8 NADH (GAUZE/BANDAGES/DRESSINGS) IMPLANT
PATTIES SURGICAL .5 X.5 (GAUZE/BANDAGES/DRESSINGS) IMPLANT
PATTIES SURGICAL .5 X1 (DISPOSABLE) IMPLANT
PATTIES SURGICAL 1X1 (DISPOSABLE) IMPLANT
ROD RELINE TI LORD 5.5X70 (Rod) ×2 IMPLANT
SCREW LOCK RELINE 5.5 TULIP (Screw) ×6 IMPLANT
SCREW RELINE-O POLY 6.5X40 (Screw) ×4 IMPLANT
SCREW RELINE-O POLY 6.5X45 (Screw) ×2 IMPLANT
SPONGE LAP 4X18 X RAY DECT (DISPOSABLE) IMPLANT
SPONGE SURGIFOAM ABS GEL 100 (HEMOSTASIS) ×2 IMPLANT
STAPLER SKIN PROX WIDE 3.9 (STAPLE) IMPLANT
STRIP CLOSURE SKIN 1/2X4 (GAUZE/BANDAGES/DRESSINGS) ×1 IMPLANT
SUT VIC AB 1 CT1 18XBRD ANBCTR (SUTURE) ×2 IMPLANT
SUT VIC AB 1 CT1 8-18 (SUTURE) ×4
SUT VIC AB 2-0 CT1 18 (SUTURE) ×4 IMPLANT
SUT VIC AB 3-0 SH 8-18 (SUTURE) ×4 IMPLANT
SYR 20CC LL (SYRINGE) ×1 IMPLANT
SYR 20ML ECCENTRIC (SYRINGE) ×1 IMPLANT
SYR 3ML LL SCALE MARK (SYRINGE) ×4 IMPLANT
SYR 5ML LL (SYRINGE) IMPLANT
TOWEL OR 17X24 6PK STRL BLUE (TOWEL DISPOSABLE) ×2 IMPLANT
TOWEL OR 17X26 10 PK STRL BLUE (TOWEL DISPOSABLE) ×2 IMPLANT
TRAP SPECIMEN MUCOUS 40CC (MISCELLANEOUS) ×2 IMPLANT
TRAY FOLEY W/METER SILVER 14FR (SET/KITS/TRAYS/PACK) ×2 IMPLANT
WATER STERILE IRR 1000ML POUR (IV SOLUTION) ×2 IMPLANT

## 2014-10-25 NOTE — Anesthesia Preprocedure Evaluation (Signed)
Anesthesia Evaluation  Patient identified by MRN, date of birth, ID band Patient awake    Reviewed: Allergy & Precautions, NPO status , Patient's Chart, lab work & pertinent test results  History of Anesthesia Complications Negative for: history of anesthetic complications  Airway Mallampati: I  TM Distance: >3 FB Neck ROM: Full    Dental  (+) Teeth Intact, Dental Advisory Given   Pulmonary neg pulmonary ROS,  breath sounds clear to auscultation        Cardiovascular hypertension, Pt. on medications and Pt. on home beta blockers - anginaRhythm:Regular Rate:Normal     Neuro/Psych Anxiety Depression Chronic back pain    GI/Hepatic Neg liver ROS, GERD-  Controlled,  Endo/Other  negative endocrine ROS  Renal/GU negative Renal ROS     Musculoskeletal  (+) Arthritis -, Osteoarthritis,  Fibromyalgia -  Abdominal (+) - obese,   Peds  Hematology negative hematology ROS (+)   Anesthesia Other Findings   Reproductive/Obstetrics                             Anesthesia Physical Anesthesia Plan  ASA: II  Anesthesia Plan: General   Post-op Pain Management:    Induction: Intravenous  Airway Management Planned: Oral ETT  Additional Equipment:   Intra-op Plan:   Post-operative Plan: Extubation in OR  Informed Consent: I have reviewed the patients History and Physical, chart, labs and discussed the procedure including the risks, benefits and alternatives for the proposed anesthesia with the patient or authorized representative who has indicated his/her understanding and acceptance.   Dental advisory given  Plan Discussed with: CRNA and Surgeon  Anesthesia Plan Comments: (Plan routine monitors, GETA)        Anesthesia Quick Evaluation

## 2014-10-25 NOTE — Progress Notes (Signed)
PT Cancellation Note  Patient Details Name: Stephanie Kim MRN: [id number] DOB: 09-Mar-1951   Cancelled Treatment:    Reason Eval/Treat Not Completed: Patient not medically ready (Orders for OOB and ambulated POD1), will follow in am for evaluation.   [name], [name] 10/25/2014, 3:33 PM [name], PT DPT  [phone]

## 2014-10-25 NOTE — Progress Notes (Signed)
Sleepy, but arousable.  MAEW.  Doing well.  

## 2014-10-25 NOTE — Op Note (Signed)
10/25/2014  11:28 AM  PATIENT:  Stephanie Kim  64 y.o. female  PRE-OPERATIVE DIAGNOSIS:  Spondylolisthesis, Lumbar region; Low back pain; Spinal stenosis of lumbar region; Lumbar radiculopathy, back pain  POST-OPERATIVE DIAGNOSIS:  Spondylolisthesis, Lumbar region; Low back pain; Spinal stenosis of lumbar region; Lumbar radiculopathy, back pain  PROCEDURE:  Procedure(s) with comments: Exploration of Lumbar five-Sacral one Fusion with removal of previous hardware Lumbar three-four, Lumbar four-five Posterior lumbar interbody fusion (N/A) - Exploration of L5-S1 Fusion with removal of previous hardwar/L3-4 L4-5 Posterior lumbar interbody fusion with PEEK cages, autograft, allograft, pedicle screw fixation L 3 - L 5 levels with posterolateral arthrodesis  Decompression greater than for standard PLIF procedure  SURGEON:  Surgeon(s) and Role:    * Oliwia Berzins, MD - Primary    * Neelesh Nundkumar, MD - Assisting  PHYSICIAN ASSISTANT:   ASSISTANTS: Poteat, RN   ANESTHESIA:   general  EBL:  Total I/O In: 1500 [I.V.:1500] Out: 650 [Urine:350; Blood:300]  BLOOD ADMINISTERED:none  DRAINS: (Medium) Hemovact drain(s) in the epidural space with  Suction Open   LOCAL MEDICATIONS USED:  MARCAINE    and LIDOCAINE   SPECIMEN:  No Specimen  DISPOSITION OF SPECIMEN:  N/A  COUNTS:  YES  TOURNIQUET:  * No tourniquets in log *  DICTATION: Patient is 64-year-old woman with  HNP, spondylolisthesis, stenosis, DDD, radiculopathy with prior fusion at the L 5 S1 level. She has severe bilateral leg pain, left greater than right and weakness. It was elected to take her to surgery for decompression and fusion at L 34 and L4/5 with exploration of prior fusion at the L5/S1 level.    Procedure: Patient was placed in a prone position on the Jackson table after smooth and uncomplicated induction of general endotracheal anesthesia. Her low back was prepped and draped in usual sterile fashion with betadine  scrub and DuraPrep. Area of incision was infiltrated with local lidocaine. Incision was made to the lumbodorsal fascia was incised and exposure was performed of the L 34,  L4/5,  spinous processes laminae facet joint and transverse processes and the previously operated L 5 S 1 level. Previously placed hardware was exposed and removed.  Prior fusion was carefully inspected and there appeared to be good arthrodesis with bridging bone.  The screws were firmly in the bone without loosening and were removed.  A total laminectomy of L3 and L4  was performed with disarticulation of the facet joints at this level and thorough decompression was performed of both L3, L4 and L 5 nerve roots along with the common dural tube. There was densely adherent spondylytic material compressing the thecal sac and both L4 and L5 nerve roots.  Decompression was greater than would be typically performed for simple interbody fusion. A thorough discectomy and preparation of the endplates was performed at both the L4/5 and L 34 levels.  The interspaces were packed with  PEEK cages (8 x 23 x 8 degree at both levels) . Bone autograft was packed within the interspace towards the midline ( 10 cc at each level)  along with Osteocel bone graft extender. All neural elements were well decompressed and carefully cleared from investing scar tissue.  The posterolateral region was extensively decorticated and pedicle probes were placed at L 3,  L4, L5 bilaterally. Intraoperative fluoroscopy confirmed correct orientationin the AP and lateral plane. 40 x 6.5 mm pedicle screws were placed at L5 bilaterally and 40 x 6.5 mm screws placed at L4 bilaterally and 45x 6.5mm screws   were placed at L3 bilaterally.  Final x-rays demonstrated well-positioned interbody grafts and pedicle screw fixation. 70 mm lordotic rods were placed and locked down in situ and the posterolateral region was packed with the remaining bone graft  Bilaterally (20 cc on each side).. A medium  Hemovac drain was placed and anchored with a stitch.  Long-acting Marcaine was injected into the dep musculature. Fascia was closed with 1 Vicryl sutures skin edges were reapproximated 2 and 3-0 Vicryl sutures. The wound is dressed with Dermabond and an occlusive dressing. The patient was extubated in the operating room and taken to recovery in stable satisfactory condition having tolerated the operation well. Counts were correct at the end of the case.    PLAN OF CARE: Admit to inpatient   PATIENT DISPOSITION:  PACU - hemodynamically stable.   Delay start of Pharmacological VTE agent (>24hrs) due to surgical blood loss or risk of bleeding: yes

## 2014-10-25 NOTE — Anesthesia Postprocedure Evaluation (Signed)
  Anesthesia Post-op Note  Patient: Stephanie Kim  Procedure(s) Performed: Procedure(s) with comments: Exploration of Lumbar five-Sacral one Fusion with removal of previous hardware Lumbar three-four, Lumbar four-five Posterior lumbar interbody fusion (N/A) - Exploration of L5-S1 Fusion with removal of previous hardwar/L3-4 L4-5 Posterior lumbar interbody fusion  Patient Location: PACU  Anesthesia Type:General  Level of Consciousness: sedated, patient cooperative and responds to stimulation  Airway and Oxygen Therapy: Patient Spontanous Breathing and Patient connected to nasal cannula oxygen  Post-op Pain: none  Post-op Assessment: Post-op Vital signs reviewed, Patient's Cardiovascular Status Stable, Respiratory Function Stable, Patent Airway, No signs of Nausea or vomiting and Pain level controlled LLE Motor Response: Responds to commands LLE Sensation: Full sensation RLE Motor Response: Responds to commands RLE Sensation: Full sensation      Post-op Vital Signs: Reviewed and stable  Last Vitals:  Filed Vitals:   10/25/14 1130  BP: 100/58  Pulse: 68  Temp: 36.7 C  Resp: 15    Complications: No apparent anesthesia complications

## 2014-10-25 NOTE — Interval H&P Note (Signed)
History and Physical Interval Note:  10/25/2014 7:30 AM  Stephanie Kim  has presented today for surgery, with the diagnosis of Spondylolisthesis, Lumbar region; Low back pain; Spinal stenosis of lumbar region; Lumbar radiculopathy  The various methods of treatment have been discussed with the patient and family. After consideration of risks, benefits and other options for treatment, the patient has consented to  Procedure(s) with comments: Exploration of L5-S1 Fusion with removal of previous hardwar/L3-4 L4-5 Posterior lumbar interbody fusion (N/A) - Exploration of L5-S1 Fusion with removal of previous hardwar/L3-4 L4-5 Posterior lumbar interbody fusion as a surgical intervention .  The patient's history has been reviewed, patient examined, no change in status, stable for surgery.  I have reviewed the patient's chart and labs.  Questions were answered to the patient's satisfaction.     [name] D

## 2014-10-25 NOTE — H&P (Signed)
Patient ID:   [id number] Patient: Stephanie Kim  Date of Birth: Oct 15, 1950 Visit Type: Office Visit   Date: 09/21/2014 03:45 PM Provider: Danae OrleansJoseph D. Venetia MaxonStern MD   This 64 year old female presents for Back pain.  History of Present Illness: 1.  Back pain  Patient returns to review her MRI  I reviewed the patient's MRI with her.  This demonstrates mobile spondylolisthesis of L4 on L5 with severe spinal stenosis and marked facet arthropathy at the L4 L5 level.  In addition there is significant facet arthropathy right greater than left of the L3 L4 level with moderately severe spinal stenosis and spondylolisthesis at this level as well.  There appears to be solid arthrodesis at previously operated L5-S1 levels and she has posterior pedicle screw fixation hardware from L5 through S1 levels.  The patient continues to complain of severe pain.  She had previously undergone surgery by Dr. Phoebe PerchHirsch.  At this point I believe that she'll need to undergo exploration of prior fusion with removal of previously placed hardware and with decompression and in her body fusion L3 through L5 levels.  I do not believe that there is a non-surgical option which will be of benefit to her.  The severity of the spinal stenosis along with spondylolisthesis is quite marked, particularly at the L4 L5 level.  We lengthy discussion with patient about risks and benefits of surgery and she wishes to proceed.  She was fitted for an LSO brace.  We went in detail with regard to patient teaching and what to expect around surgery.  Surgery is scheduled for October 21, 2014.  Prior fusion hardware will need to be removed and we are researching her previous operative records.  Patient continues to have weakness and pain in both of her lower extremities.        PAST MEDICAL/SURGICAL HISTORY   (Detailed)  Disease/disorder Onset Date Management Date Comments  Depression      High cholesterol      Hypertension       DIAGNOSTICS  HISTORY: Test Ordered Ordering Comments Modifier  Cervical Spine- AP/Lat/Flex/Ex 08/24/2014 1 of 2  4V C/S and 4V L/S   Lumbar Spine- AP/Lat/Flex/Ex 08/24/2014 2 of 2  4V L/S and 4V C/S     PAST MEDICAL HISTORY, SURGICAL HISTORY, FAMILY HISTORY, SOCIAL HISTORY AND REVIEW OF SYSTEMS I have reviewed the patient's past medical, surgical, family and social history as well as the comprehensive review of systems as included on the [facility] history form dated 08/25/2014, which I have signed.  Family History  (Detailed) Patient reports there is no relevant family history.    SOCIAL HISTORY  (Detailed) Tobacco use reviewed. Preferred language is Unknown.   Smoking status: Never smoker.  SMOKING STATUS Use Status Type Smoking Status Usage Per Day Years Used Total Pack Years  no/never  Never smoker             MEDICATIONS(added, continued or stopped this visit): Started Medication Directions Instruction Stopped   atorvastatin 10 mg tablet take 1 tablet by oral route  every day     bupropion HCl XL 150 mg 24 hr tablet, extended release take 1 tablet by oral route  every day     lisinopril 20 mg tablet take 1 tablet by oral route  every day     metoprolol succinate ER 25 mg tablet,extended release 24 hr take 1 tablet by oral route  every day     sertraline 100 mg  tablet take 1 tablet by oral route  every day       ALLERGIES: Ingredient Reaction Medication Name Comment  ASPIRIN Unknown     Reviewed, no changes.    Vitals Date Temp F BP Pulse Ht In Wt Lb BMI BSA Pain Score  09/21/2014  153/96 64 61.5 125 23.24  5/10      IMPRESSION Mobile spondylolisthesis with severe spinal stenosis L3 L4 and L4-L5 levels with prior fusion at L5 through S1 level.  Completed Orders (this encounter) Order Details Reason Side Interpretation Result Initial Treatment Date Region  Hypertension education Follow up with primary care physician.          Assessment/Plan # Detail Type Description   1. Assessment Low back pain, unspecified back pain laterality, with sciatica presence unspecified (M54.5).       2. Assessment Spinal stenosis of lumbar region (M48.06).       3. Assessment Lumbar radiculopathy (M54.16).       4. Assessment Essential (primary) hypertension (I10).       5. Assessment Spondylolisthesis, lumbar region (M43.16).         Pain Assessment/Treatment Pain Scale: 5/10. Method: Numeric Pain Intensity Scale. Location: back. Onset: 08/17/2014. Duration: varies. Quality: discomforting. Pain Assessment/Treatment follow-up plan of care: Patient is taking medications as prescribed..  Exploration of prior fusion with decompression and fusion L3 through L5 levels.  Orders: Instruction(s)/Education: Assessment Instruction  I10 Hypertension education             Provider:  Danae Orleans. Venetia Maxon MD  09/22/2014 10:28 AM Dictation edited by: Danae Orleans. Venetia Maxon    CC Providers: Maeola Harman MD [address] [place], [place] [address]              Electronically signed by Danae Orleans. Venetia Maxon MD on 09/22/2014 10:28 AM

## 2014-10-25 NOTE — Transfer of Care (Signed)
Immediate Anesthesia Transfer of Care Note  Patient: Stephanie Kim  Procedure(s) Performed: Procedure(s) with comments: Exploration of Lumbar five-Sacral one Fusion with removal of previous hardware Lumbar three-four, Lumbar four-five Posterior lumbar interbody fusion (N/A) - Exploration of L5-S1 Fusion with removal of previous hardwar/L3-4 L4-5 Posterior lumbar interbody fusion  Patient Location: PACU  Anesthesia Type:General  Level of Consciousness: awake, alert  and oriented  Airway & Oxygen Therapy: Patient Spontanous Breathing and Patient connected to nasal cannula oxygen  Post-op Assessment: Report given to RN and Post -op Vital signs reviewed and stable  Post vital signs: Reviewed and stable  Last Vitals:  Filed Vitals:   10/25/14 0632  BP: 148/94  Pulse: 55  Temp: 36.6 C  Resp: 20    Complications: No apparent anesthesia complications

## 2014-10-25 NOTE — Brief Op Note (Signed)
10/25/2014  11:28 AM  PATIENT:  Stephanie Kim  64 y.o. female  PRE-OPERATIVE DIAGNOSIS:  Spondylolisthesis, Lumbar region; Low back pain; Spinal stenosis of lumbar region; Lumbar radiculopathy, back pain  POST-OPERATIVE DIAGNOSIS:  Spondylolisthesis, Lumbar region; Low back pain; Spinal stenosis of lumbar region; Lumbar radiculopathy, back pain  PROCEDURE:  Procedure(s) with comments: Exploration of Lumbar five-Sacral one Fusion with removal of previous hardware Lumbar three-four, Lumbar four-five Posterior lumbar interbody fusion (N/A) - Exploration of L5-S1 Fusion with removal of previous hardwar/L3-4 L4-5 Posterior lumbar interbody fusion with PEEK cages, autograft, allograft, pedicle screw fixation L 3 - L 5 levels with posterolateral arthrodesis  Decompression greater than for standard PLIF procedure  SURGEON:  Surgeon(s) and Role:    * Maeola Harman, MD - Primary    * Lisbeth Renshaw, MD - Assisting  PHYSICIAN ASSISTANT:   ASSISTANTS: Poteat, RN   ANESTHESIA:   general  EBL:  Total I/O In: 1500 [I.V.:1500] Out: 650 [Urine:350; Blood:300]  BLOOD ADMINISTERED:none  DRAINS: (Medium) Hemovact drain(s) in the epidural space with  Suction Open   LOCAL MEDICATIONS USED:  MARCAINE    and LIDOCAINE   SPECIMEN:  No Specimen  DISPOSITION OF SPECIMEN:  N/A  COUNTS:  YES  TOURNIQUET:  * No tourniquets in log *  DICTATION: Patient is 64 year old woman with  HNP, spondylolisthesis, stenosis, DDD, radiculopathy with prior fusion at the L 5 S1 level. She has severe bilateral leg pain, left greater than right and weakness. It was elected to take her to surgery for decompression and fusion at L 34 and L4/5 with exploration of prior fusion at the L5/S1 level.    Procedure: Patient was placed in a prone position on the Lafontaine table after smooth and uncomplicated induction of general endotracheal anesthesia. Her low back was prepped and draped in usual sterile fashion with betadine  scrub and DuraPrep. Area of incision was infiltrated with local lidocaine. Incision was made to the lumbodorsal fascia was incised and exposure was performed of the L 34,  L4/5,  spinous processes laminae facet joint and transverse processes and the previously operated L 5 S 1 level. Previously placed hardware was exposed and removed.  Prior fusion was carefully inspected and there appeared to be good arthrodesis with bridging bone.  The screws were firmly in the bone without loosening and were removed.  A total laminectomy of L3 and L4  was performed with disarticulation of the facet joints at this level and thorough decompression was performed of both L3, L4 and L 5 nerve roots along with the common dural tube. There was densely adherent spondylytic material compressing the thecal sac and both L4 and L5 nerve roots.  Decompression was greater than would be typically performed for simple interbody fusion. A thorough discectomy and preparation of the endplates was performed at both the L4/5 and L 34 levels.  The interspaces were packed with  PEEK cages (8 x 23 x 8 degree at both levels) . Bone autograft was packed within the interspace towards the midline ( 10 cc at each level)  along with Osteocel bone graft extender. All neural elements were well decompressed and carefully cleared from investing scar tissue.  The posterolateral region was extensively decorticated and pedicle probes were placed at L 3,  L4, L5 bilaterally. Intraoperative fluoroscopy confirmed correct orientationin the AP and lateral plane. 40 x 6.5 mm pedicle screws were placed at L5 bilaterally and 40 x 6.5 mm screws placed at L4 bilaterally and 45x 6.17mm screws  were placed at L3 bilaterally.  Final x-rays demonstrated well-positioned interbody grafts and pedicle screw fixation. 70 mm lordotic rods were placed and locked down in situ and the posterolateral region was packed with the remaining bone graft  Bilaterally (20 cc on each side).. A medium  Hemovac drain was placed and anchored with a stitch.  Long-acting Marcaine was injected into the dep musculature. Fascia was closed with 1 Vicryl sutures skin edges were reapproximated 2 and 3-0 Vicryl sutures. The wound is dressed with Dermabond and an occlusive dressing. The patient was extubated in the operating room and taken to recovery in stable satisfactory condition having tolerated the operation well. Counts were correct at the end of the case.    PLAN OF CARE: Admit to inpatient   PATIENT DISPOSITION:  PACU - hemodynamically stable.   Delay start of Pharmacological VTE agent (>24hrs) due to surgical blood loss or risk of bleeding: yes

## 2014-10-26 MED ORDER — SODIUM CHLORIDE 0.9 % IV SOLN: INTRAVENOUS | Status: DC

## 2014-10-26 MED ORDER — PANTOPRAZOLE SODIUM 40 MG PO TBEC
40.0000 mg | DELAYED_RELEASE_TABLET | Freq: Every day | ORAL | Status: DC
  Administered 2014-10-26 – 2014-10-27 (×2): 40 mg via ORAL
  Filled 2014-10-26 (×2): qty 1

## 2014-10-26 MED ORDER — WHITE PETROLATUM GEL
Status: AC
  Filled 2014-10-26: qty 1

## 2014-10-26 MED ORDER — SODIUM CHLORIDE 0.9 % IV BOLUS (SEPSIS)
500.0000 mL | Freq: Once | INTRAVENOUS | Status: AC
  Administered 2014-10-26: 500 mL via INTRAVENOUS

## 2014-10-26 MED FILL — Sodium Chloride IV Soln 0.9%: INTRAVENOUS | Qty: 1000 | Status: AC

## 2014-10-26 MED FILL — Heparin Sodium (Porcine) Inj 1000 Unit/ML: INTRAMUSCULAR | Qty: 30 | Status: AC

## 2014-10-26 NOTE — Evaluation (Signed)
Occupational Therapy Evaluation Patient Details Name: Stephanie Kim MRN: [id number] DOB: [birth year] Today's Date: 10/26/2014    History of Present Illness 64 y.o. female s/p L5-S1 fusion with removal of previous hardware.   Clinical Impression   Patient is s/p L5-S1 fusion surgery resulting in functional limitations due to the deficits listed below (see OT problem list). Pt able to verbalize 2/3 precautions; education provided on maintaining precautions during ADLs and handout reviewed. Pt demonstrated ability to don/doff brace with supervision. Pt required A for bed mobility due to pain. Patient will benefit from skilled OT acutely to increase independence and safety with ADLS to allow discharge home.     Follow Up Recommendations  No OT follow up;Supervision/Assistance - 24 hour    Equipment Recommendations  Other (comment) (Pt requesting RW)    Recommendations for Other Services       Precautions / Restrictions Precautions Precautions: Back Precaution Booklet Issued: Yes (comment) Precaution Comments: handout given Required Braces or Orthoses: Spinal Brace Spinal Brace: Lumbar corset;Applied in sitting position Restrictions Weight Bearing Restrictions: No      Mobility Bed Mobility Overal bed mobility: Needs Assistance Bed Mobility: Sidelying to Sit;Rolling Rolling: Min assist Sidelying to sit: Min assist     Sit to sidelying: Min assist General bed mobility comments: assist for lifting trunk, pt performed from flat bed without rail  Transfers Overall transfer level: Needs assistance Equipment used: Rolling walker (2 wheeled) Transfers: Sit to/from Stand Sit to Stand: Min guard;Min assist         General transfer comment: min assist from bed, cues for hand placement, minguard from toilet    Balance Overall balance assessment: Needs assistance Sitting-balance support: No upper extremity supported;Feet supported       Standing balance support:  Bilateral upper extremity supported   Standing balance comment: UE support for balance, more due to back pain                            ADL Overall ADL's : Needs assistance/impaired Eating/Feeding: Independent;Sitting   Grooming: Min guard;Standing (cuing for back precautions)   Upper Body Bathing: Min guard;Sitting   Lower Body Bathing: Min guard;Sit to/from stand   Upper Body Dressing : Supervision/safety;Sitting Upper Body Dressing Details (indicate cue type and reason): don/doff brace Lower Body Dressing: Supervision/safety;Cueing for back precautions;Sit to/from stand (don/doff socks)   Toilet Transfer: Min Nurse, learning disability Details (indicate cue type and reason): Pt has 3in1 at home from previous surgery Toileting- Clothing Manipulation and Hygiene: Supervision/safety;Sit to/from stand   Tub/ Engineer, structural: Ambulation;3 in 1;Rolling walker;Minimal assistance   Functional mobility during ADLs: Min guard;Rolling walker       Vision Vision Assessment?: No apparent visual deficits   Perception     Praxis      Pertinent Vitals/Pain Pain Assessment: 0-10 Pain Score: 5  Pain Location: back Pain Descriptors / Indicators: Sore;Aching Pain Intervention(s): Monitored during session;Repositioned     Hand Dominance Right   Extremity/Trunk Assessment Upper Extremity Assessment Upper Extremity Assessment: Overall WFL for tasks assessed   Lower Extremity Assessment Lower Extremity Assessment: Overall WFL for tasks assessed       Communication Communication Communication: No difficulties   Cognition Arousal/Alertness: Awake/alert Behavior During Therapy: WFL for tasks assessed/performed Overall Cognitive Status: Within Functional Limits for tasks assessed                     General Comments  Exercises       Shoulder Instructions      Home Living Family/patient expects to be discharged to:: Private residence Living  Arrangements: Spouse/significant other;Other relatives (brother coming to help) Available Help at Discharge: Family;Available 24 hours/day Type of Home: House Home Access: Stairs to enter Entergy CorporationEntrance Stairs-Number of Steps: 12-16 Entrance Stairs-Rails: Right;Left Home Layout: One level     Bathroom Shower/Tub: Tub/shower unit;Curtain Shower/tub characteristics: Engineer, building servicesCurtain Bathroom Toilet: Standard     Home Equipment: Merchant navy officerAdaptive equipment;Bedside commode Adaptive Equipment: Reacher        Prior Functioning/Environment Level of Independence: Independent             OT Diagnosis: Generalized weakness;Acute pain   OT Problem List: Decreased strength;Decreased activity tolerance;Impaired balance (sitting and/or standing);Decreased knowledge of use of DME or AE;Decreased knowledge of precautions;Pain   OT Treatment/Interventions: Self-care/ADL training;Therapeutic exercise;DME and/or AE instruction;Therapeutic activities;Patient/family education;Balance training    OT Goals(Current goals can be found in the care plan section) Acute Rehab OT Goals Patient Stated Goal: to get better OT Goal Formulation: With patient Time For Goal Achievement: 11/09/14 Potential to Achieve Goals: Good ADL Goals Pt Will Perform Grooming: with supervision;standing Pt Will Perform Lower Body Dressing: with modified independence;sit to/from stand;with adaptive equipment Pt Will Transfer to Toilet: with supervision;ambulating;bedside commode Pt Will Perform Tub/Shower Transfer: Tub transfer;with supervision;ambulating;3 in 1;rolling walker Additional ADL Goal #1: Pt will verbalize 3/3 precautions in preparation for ADLs  OT Frequency: Min 2X/week   Barriers to D/C:            Co-evaluation              End of Session Equipment Utilized During Treatment: Gait belt;Rolling walker;Back brace Nurse Communication: Mobility status;Precautions;Patient requests pain meds  Activity Tolerance: Patient  limited by pain Patient left: in bed;with call bell/phone within reach;with nursing/sitter in room   Time: 1250-1311 OT Time Calculation (min): 21 min Charges:  OT General Charges $OT Visit: 1 Procedure OT Evaluation $Initial OT Evaluation Tier I: 1 Procedure G-Codes:    Marden NobleMary Rebecca Lazer Kim 10/26/2014, 2:00 PM

## 2014-10-26 NOTE — Evaluation (Signed)
Physical Therapy Evaluation Patient Details Name: Stephanie Kim MRN: [id number] DOB: Dec 19, 1950 Today's Date: 10/26/2014   History of Present Illness  64 y.o. female with spondylolisthesis and SS h/o L5-S1 fusion now s/p hardware removal and L3-4 L4-5 Posterior lumbar interbody fusion.  Clinical Impression  Patient presents with decreased mobility due to deficits listed in PT problem list.  She will benefit from skilled PT in the acute setting to allow return home with family assist and follow up HHPT.  Patient educated in back precautions and will benefit from continued skilled PT after d/c to reinforce in the home environment.      Follow Up Recommendations Home health PT;Supervision - Intermittent    Equipment Recommendations  Rolling walker with 5" wheels (youth walker)    Recommendations for Other Services       Precautions / Restrictions Precautions Precautions: Back Precaution Booklet Issued: Yes (comment) Precaution Comments: handout given Required Braces or Orthoses: Spinal Brace Spinal Brace: Lumbar corset;Applied in sitting position Restrictions Weight Bearing Restrictions: No      Mobility  Bed Mobility Overal bed mobility: Needs Assistance Bed Mobility: Sidelying to Sit;Rolling Rolling: Min assist Sidelying to sit: Min assist     Sit to sidelying: Min assist General bed mobility comments: assist for lifting trunk, pt performed from flat bed without rail  Transfers Overall transfer level: Needs assistance Equipment used: Rolling walker (2 wheeled) Transfers: Sit to/from Stand Sit to Stand: Min guard;Min assist         General transfer comment: min assist from bed, cues for hand placement, minguard from toilet  Ambulation/Gait Ambulation/Gait assistance: Min guard Ambulation Distance (Feet): 180 Feet Assistive device: Rolling walker (2 wheeled) Gait Pattern/deviations: Step-through pattern;Decreased stride length     General Gait Details: veers  mildly to right, possibly due to walker; initially cues for positioning in walker  Stairs            Wheelchair Mobility    Modified Rankin (Stroke Patients Only)       Balance Overall balance assessment: Needs assistance Sitting-balance support: No upper extremity supported;Feet supported       Standing balance support: Bilateral upper extremity supported   Standing balance comment: UE support for balance, more due to back pain                             Pertinent Vitals/Pain Pain Assessment: 0-10 Pain Score: 5  Pain Location: back Pain Descriptors / Indicators: Sore;Aching Pain Intervention(s): Monitored during session;Repositioned    Home Living Family/patient expects to be discharged to:: Private residence Living Arrangements: Spouse/significant other;Other relatives (brother coming to help) Available Help at Discharge: Family;Available 24 hours/day Type of Home: House Home Access: Stairs to enter Entrance Stairs-Rails: Doctor, general practice of Steps: 12-16 Home Layout: One level Home Equipment: Adaptive equipment;Bedside commode      Prior Function Level of Independence: Independent               Hand Dominance   Dominant Hand: Right    Extremity/Trunk Assessment   Upper Extremity Assessment: Overall WFL for tasks assessed           Lower Extremity Assessment: Overall WFL for tasks assessed         Communication   Communication: No difficulties  Cognition Arousal/Alertness: Awake/alert Behavior During Therapy: WFL for tasks assessed/performed Overall Cognitive Status: Within Functional Limits for tasks assessed  General Comments General comments (skin integrity, edema, etc.): patient lying in bed with legs turned to side and reaching to opposite side for meds, educated in back precautions for hip and shoulder to be lined up for preventing further back issues    Exercises         Assessment/Plan    PT Assessment Patient needs continued PT services  PT Diagnosis Difficulty walking;Acute pain   PT Problem List Decreased strength;Pain;Decreased activity tolerance;Decreased balance;Decreased knowledge of precautions;Decreased mobility;Decreased knowledge of use of DME  PT Treatment Interventions DME instruction;Balance training;Gait training;Stair training;Functional mobility training;Patient/family education;Therapeutic exercise;Therapeutic activities   PT Goals (Current goals can be found in the Care Plan section) Acute Rehab PT Goals Patient Stated Goal: to get better PT Goal Formulation: With patient Time For Goal Achievement: 11/02/14 Potential to Achieve Goals: Good    Frequency Min 6X/week   Barriers to discharge        Co-evaluation               End of Session Equipment Utilized During Treatment: Back brace Activity Tolerance: Patient tolerated treatment well Patient left: in chair;with call bell/phone within reach           Time: 1158-1228 PT Time Calculation (min) (ACUTE ONLY): 30 min   Charges:   PT Evaluation $Initial PT Evaluation Tier I: 1 Procedure PT Treatments $Gait Training: 8-22 mins   PT G Codes:        Stephanie Kim,Stephanie Kim 10/26/2014, 1:41 PM  Sheran Lawlessyndi Stephanie Kim, PT [phone] 10/26/2014

## 2014-10-26 NOTE — Progress Notes (Signed)
Subjective: Patient reports back is painful, no leg discomfort  Objective: Vital signs in last 24 hours: Temp:  [97.9 F (36.6 C)-99.2 F (37.3 C)] 99.2 F (37.3 C) (07/06 0515) Pulse Rate:  [63-75] 73 (07/06 0515) Resp:  [15-18] 16 (07/06 0515) BP: (72-102)/(40-67) 80/52 mmHg (07/06 0515) SpO2:  [95 %-100 %] 95 % (07/06 0515) Weight:  [56.065 kg (123 lb 9.6 oz)] 56.065 kg (123 lb 9.6 oz) (07/05 2135)  Intake/Output from previous day: 07/05 0701 - 07/06 0700 In: 2050 [I.V.:1800] Out: 4525 [Urine:4100; Drains:125; Blood:300] Intake/Output this shift:    Physical Exam: Full strength both legs.  Dressing CDI  Lab Results: No results for input(s): WBC, HGB, HCT, PLT in the last 72 hours. BMET No results for input(s): NA, K, CL, CO2, GLUCOSE, BUN, CREATININE, CALCIUM in the last 72 hours.  Studies/Results: Dg Lumbar Spine 2-3 Views  10/25/2014   CLINICAL DATA:  Exploration, removal of prior hardware, fusion at L5-S1.  EXAM: LUMBAR SPINE - 2-3 VIEW; DG C-ARM 61-120 MIN  COMPARISON:  MRI 11/11/2013  FLUOROSCOPY TIME:  Radiation Exposure Index (as provided by the fluoroscopic device):  If the device does not provide the exposure index:  Fluoroscopy Time:  23 seconds  Number of Acquired Images:  0  FINDINGS: Two saved intraoperative spot images demonstrate removal of hardware at the L5-S1 level in placement of pedicle screws at L3, L4 and L5. Normal alignment. No hardware or bony complicating feature.  IMPRESSION: Posterior fusion changes at L3-L5. Removal of hardware at L5 and S1. No visible complicating features.   Electronically Signed   By: Charlett NoseKevin  Dover M.D.   On: 10/25/2014 11:18   Dg Lumbar Spine 1 View  10/25/2014   CLINICAL DATA:  Surgery. Elective expression L5-S1 fusion mobile previous hardware.  EXAM: LUMBAR SPINE - 1 VIEW  COMPARISON:  MRI lumbar spine 11/12/2011 and lumbar spine radiographs 05/14/2010.  FINDINGS: Lumbar spine vertebral bodies are normal in height and alignment.  Pre-existing pedicle screws at L5 and S1 on the radiographs and 2012 have been removed. Interbody spacer is present at L5-S1.  A metallic probe projects over the posterior elements of L4.  A metallic probe projects over the L5 vertebral body.  A more horizontally oriented probe projects posterior to the L3 posterior elements.  Soft tissue spreaders are seen posterior to L3, L4, and L5-S1.  The lumbar spine vertebral bodies were labeled, as compared to radiographs of 2012.  IMPRESSION: Lumbar spine labeling as above.   Electronically Signed   By: Britta MccreedySusan  Turner M.D.   On: 10/25/2014 11:19   Dg C-arm 1-60 Min  10/25/2014   CLINICAL DATA:  Exploration, removal of prior hardware, fusion at L5-S1.  EXAM: LUMBAR SPINE - 2-3 VIEW; DG C-ARM 61-120 MIN  COMPARISON:  MRI 11/11/2013  FLUOROSCOPY TIME:  Radiation Exposure Index (as provided by the fluoroscopic device):  If the device does not provide the exposure index:  Fluoroscopy Time:  23 seconds  Number of Acquired Images:  0  FINDINGS: Two saved intraoperative spot images demonstrate removal of hardware at the L5-S1 level in placement of pedicle screws at L3, L4 and L5. Normal alignment. No hardware or bony complicating feature.  IMPRESSION: Posterior fusion changes at L3-L5. Removal of hardware at L5 and S1. No visible complicating features.   Electronically Signed   By: Charlett NoseKevin  Dover M.D.   On: 10/25/2014 11:18    Assessment/Plan: BP low, will give fluid bolus.   Continue drain today.  Mobilize with  PT.  D/C Foley.    LOS: 1 day    Dorian Heckle, MD 10/26/2014, 8:03 AM

## 2014-10-26 NOTE — Clinical Social Work Note (Addendum)
CSW Consult Acknowledged:   CSW received a consult for SNF placement. CSW awaiting PT/OT evaluation to determine the appropriate level of care.      Addendum: Per PT/OT's notes the appropriate level of care is Home Health. CSW will sign off.     Aiyonna Lucado, MSW, LCSWA [phone]

## 2014-10-27 MED ORDER — BISACODYL 10 MG RE SUPP
10.0000 mg | Freq: Once | RECTAL | Status: AC
  Administered 2014-10-27: 10 mg via RECTAL
  Filled 2014-10-27: qty 1

## 2014-10-27 NOTE — Progress Notes (Signed)
Physical Therapy Treatment Patient Details Name: Stephanie Kim MRN: [id number] DOB: January 09, 1951 Today's Date: 10/27/2014    History of Present Illness 64 y.o. female with spondylolisthesis and SS h/o L5-S1 fusion now s/p hardware removal and L3-4 L4-5 Posterior lumbar interbody fusion.    PT Comments    Patient is progressing well. Having some increase in pain but better with mobility. Able to complete stair training this AM and husband present throughout. She is hoping to DC home soon. Reviewed bed mobility by visual feedback this session. Patient did not want to attempt  Follow Up Recommendations  Home health PT;Supervision - Intermittent     Equipment Recommendations  Rolling walker with 5" wheels    Recommendations for Other Services       Precautions / Restrictions Precautions Precautions: Back Precaution Comments: Patient able to recall 3/3 back precautions and implement with mobility Required Braces or Orthoses: Spinal Brace Spinal Brace: Lumbar corset;Applied in sitting position Restrictions Weight Bearing Restrictions: No    Mobility  Bed Mobility               General bed mobility comments: Patient up before and after session   Transfers Overall transfer level: Needs assistance Equipment used: Rolling walker (2 wheeled)   Sit to Stand: Min guard         General transfer comment: Cues for safe technique and positioning  Ambulation/Gait Ambulation/Gait assistance: Supervision Ambulation Distance (Feet): 300 Feet Assistive device: Rolling walker (2 wheeled)   Gait velocity: guarded Gait velocity interpretation: Below normal speed for age/gender General Gait Details: Cues to relax shoulders and stand upright. Cues for proximity to RW   Stairs Stairs: Yes Stairs assistance: Min guard Stair Management: Step to pattern;Sideways;One rail Right Number of Stairs: 5 General stair comments: Cues for technique  Wheelchair Mobility    Modified  Rankin (Stroke Patients Only)       Balance                                    Cognition Arousal/Alertness: Awake/alert Behavior During Therapy: WFL for tasks assessed/performed Overall Cognitive Status: Within Functional Limits for tasks assessed                      Exercises      General Comments        Pertinent Vitals/Pain Pain Score: 8  Pain Location: back but getting better as mobility increased Pain Descriptors / Indicators: Sore Pain Intervention(s): Monitored during session;Limited activity within patient's tolerance    Home Living                      Prior Function            PT Goals (current goals can now be found in the care plan section) Progress towards PT goals: Progressing toward goals    Frequency  Min 6X/week    PT Plan Current plan remains appropriate    Co-evaluation             End of Session Equipment Utilized During Treatment: Back brace Activity Tolerance: Patient tolerated treatment well Patient left: in chair;with call bell/phone within reach     Time: 0835-0900 PT Time Calculation (min) (ACUTE ONLY): 25 min  Charges:  $Gait Training: 8-22 mins $Therapeutic Activity: 8-22 mins  G Codes:      Fredrich Birks 10/27/2014, 9:05 AM  10/27/2014 Fredrich Birks PTA [phone] pager [phone] office

## 2014-10-27 NOTE — Progress Notes (Signed)
Subjective: Patient reports "I hurt just in my back, but my stomach cramps every now and then Like I need to go to the bathroom"  Objective: Vital signs in last 24 hours: Temp:  [98.4 F (36.9 C)-100.2 F (37.9 C)] 100.2 F (37.9 C) (07/07 0510) Pulse Rate:  [71-97] 88 (07/07 0510) Resp:  [16-18] 18 (07/07 0510) BP: (90-110)/(51-72) 110/65 mmHg (07/07 0510) SpO2:  [92 %-98 %] 92 % (07/07 0510)  Intake/Output from previous day: 07/06 0701 - 07/07 0700 In: -  Out: 250 [Drains:250] Intake/Output this shift:    Alert, conversant. Husband present. Incision without erythema, swelling, or drainage beneath honeycomb drsg. Hemovac patent, thin bloody drainage in tube. Good strength BLE. Walked with PT yesterday. Belly is soft, nondistended, mildly tender, BS hypoactive. Pt reports last BM 6/30. Appetite fair.   Lab Results: No results for input(s): WBC, HGB, HCT, PLT in the last 72 hours. BMET No results for input(s): NA, K, CL, CO2, GLUCOSE, BUN, CREATININE, CALCIUM in the last 72 hours.  Studies/Results: Dg Lumbar Spine 2-3 Views  10/25/2014   CLINICAL DATA:  Exploration, removal of prior hardware, fusion at L5-S1.  EXAM: LUMBAR SPINE - 2-3 VIEW; DG C-ARM 61-120 MIN  COMPARISON:  MRI 11/11/2013  FLUOROSCOPY TIME:  Radiation Exposure Index (as provided by the fluoroscopic device):  If the device does not provide the exposure index:  Fluoroscopy Time:  23 seconds  Number of Acquired Images:  0  FINDINGS: Two saved intraoperative spot images demonstrate removal of hardware at the L5-S1 level in placement of pedicle screws at L3, L4 and L5. Normal alignment. No hardware or bony complicating feature.  IMPRESSION: Posterior fusion changes at L3-L5. Removal of hardware at L5 and S1. No visible complicating features.   Electronically Signed   By: Charlett NoseKevin  Dover M.D.   On: 10/25/2014 11:18   Dg Lumbar Spine 1 View  10/25/2014   CLINICAL DATA:  Surgery. Elective expression L5-S1 fusion mobile previous  hardware.  EXAM: LUMBAR SPINE - 1 VIEW  COMPARISON:  MRI lumbar spine 11/12/2011 and lumbar spine radiographs 05/14/2010.  FINDINGS: Lumbar spine vertebral bodies are normal in height and alignment. Pre-existing pedicle screws at L5 and S1 on the radiographs and 2012 have been removed. Interbody spacer is present at L5-S1.  A metallic probe projects over the posterior elements of L4.  A metallic probe projects over the L5 vertebral body.  A more horizontally oriented probe projects posterior to the L3 posterior elements.  Soft tissue spreaders are seen posterior to L3, L4, and L5-S1.  The lumbar spine vertebral bodies were labeled, as compared to radiographs of 2012.  IMPRESSION: Lumbar spine labeling as above.   Electronically Signed   By: Britta MccreedySusan  Turner M.D.   On: 10/25/2014 11:19   Dg C-arm 1-60 Min  10/25/2014   CLINICAL DATA:  Exploration, removal of prior hardware, fusion at L5-S1.  EXAM: LUMBAR SPINE - 2-3 VIEW; DG C-ARM 61-120 MIN  COMPARISON:  MRI 11/11/2013  FLUOROSCOPY TIME:  Radiation Exposure Index (as provided by the fluoroscopic device):  If the device does not provide the exposure index:  Fluoroscopy Time:  23 seconds  Number of Acquired Images:  0  FINDINGS: Two saved intraoperative spot images demonstrate removal of hardware at the L5-S1 level in placement of pedicle screws at L3, L4 and L5. Normal alignment. No hardware or bony complicating feature.  IMPRESSION: Posterior fusion changes at L3-L5. Removal of hardware at L5 and S1. No visible complicating features.  Electronically Signed   By: Charlett Nose M.D.   On: 10/25/2014 11:18    Assessment/Plan: Improving   LOS: 2 days  Will work on bowels today, Dulcolax suppository per DrStern. Continue to mobilize in LSO with PT.   Georgiann Cocker 10/27/2014, 7:48 AM

## 2014-10-27 NOTE — Progress Notes (Signed)
Occupational Therapy Treatment Patient Details Name: Stephanie Kim MRN: [id number] DOB: 03-07-1951 Today's Date: 10/27/2014    History of present illness 64 y.o. female with spondylolisthesis and SS h/o L5-S1 fusion now s/p hardware removal and L3-4 L4-5 Posterior lumbar interbody fusion.   OT comments  Pt eager to participate in therapy and asking when she is safe to d/c home. Pt able to verbalize 3/3 precautions, and maintain precautions while completing ADLs seated at sink level.  Education provided on maintaining precautions at home and use of AE for LB dressing/bathing; Pt reports family available 24/7 to A as needed.     Follow Up Recommendations  No OT follow up;Supervision/Assistance - 24 hour    Equipment Recommendations  Other (comment) (Pt requesting RW)    Recommendations for Other Services      Precautions / Restrictions Precautions Precautions: Back Precaution Comments: Pt able to verbalize 3/3 precautions and maintain during ADL Required Braces or Orthoses: Spinal Brace Spinal Brace: Lumbar corset;Applied in sitting position Restrictions Weight Bearing Restrictions: No       Mobility Bed Mobility               General bed mobility comments: Pt in chair before and after session; education provided on bed mobility technique and pillow placement for sidelying  Transfers Overall transfer level: Needs assistance Equipment used: Rolling walker (2 wheeled) Transfers: Sit to/from Stand Sit to Stand: Supervision         General transfer comment: Pt able to recall technique from previous session    Balance Overall balance assessment: Needs assistance Sitting-balance support: Feet supported;No upper extremity supported Sitting balance-Leahy Scale: Good     Standing balance support: No upper extremity supported;During functional activity Standing balance-Leahy Scale: Good                     ADL Overall ADL's : Needs assistance/impaired      Grooming: Wash/dry hands;Wash/dry face;Oral care;Applying deodorant;Brushing hair;Supervision/safety;Sitting   Upper Body Bathing: Supervision/ safety;Sitting Upper Body Bathing Details (indicate cue type and reason): pt able to maintain precautions  Lower Body Bathing: Min guard;Sit to/from stand;Adhering to back precautions Lower Body Bathing Details (indicate cue type and reason): Education provided on use of AE to A with LB bathing; Pt has reacher at home and 3in1  Upper Body Dressing : Supervision/safety;Sitting Upper Body Dressing Details (indicate cue type and reason): don/ doff brace and gown maintaining precautions Lower Body Dressing: Supervision/safety;Adhering to back precautions;Sit to/from stand Lower Body Dressing Details (indicate cue type and reason): Education on use of AE/ reacher for LB dressing; pt reports having hip kit from past surgery Toilet Transfer: Supervision/safety;Ambulation;Regular Toilet;RW   Toileting- Clothing Manipulation and Hygiene: Supervision/safety;Adhering to back precautions;Sit to/from stand   Tub/ Shower Transfer: Tub transfer;Min guard;Adhering to back precautions;Ambulation;Rolling walker   Functional mobility during ADLs: Supervision/safety;Rolling walker General ADL Comments: Pt able to maintain precautions while completing ADLs with supervision      Vision                     Perception     Praxis      Cognition   Behavior During Therapy: [facility] for tasks assessed/performed Overall Cognitive Status: Within Functional Limits for tasks assessed                       Extremity/Trunk Assessment               Exercises  Shoulder Instructions       General Comments      Pertinent Vitals/ Pain       Pain Assessment: 0-10 Pain Score: 3  Pain Location: back Pain Descriptors / Indicators: Sore Pain Intervention(s): Monitored during session;Repositioned  Home Living                                           Prior Functioning/Environment              Frequency Min 2X/week     Progress Toward Goals  OT Goals(current goals can now be found in the care plan section)  Progress towards OT goals: Progressing toward goals  Acute Rehab OT Goals Patient Stated Goal: to get better OT Goal Formulation: With patient Time For Goal Achievement: 11/09/14 Potential to Achieve Goals: Good ADL Goals Pt Will Perform Grooming: with supervision;standing Pt Will Perform Lower Body Dressing: with modified independence;sit to/from stand;with adaptive equipment Pt Will Transfer to Toilet: with supervision;ambulating;bedside commode Pt Will Perform Tub/Shower Transfer: Tub transfer;with supervision;ambulating;3 in 1;rolling walker Additional ADL Goal #1: Pt will verbalize 3/3 precautions in preparation for ADLs  Plan Discharge plan remains appropriate    Co-evaluation                 End of Session Equipment Utilized During Treatment: Gait belt;Rolling walker;Back brace   Activity Tolerance Patient tolerated treatment well;No increased pain   Patient Left in chair;with call bell/phone within reach   Nurse Communication Mobility status;Precautions        Time: 9301-2379 OT Time Calculation (min): 27 min  Charges: OT General Charges $OT Visit: 1 Procedure OT Treatments $Self Care/Home Management : 23-37 mins  Forest Gleason 10/27/2014, 11:32 AM

## 2014-10-28 NOTE — Care Management Note (Signed)
Case Management Note  Patient Details  Name: Stephanie Kim MRN: [id number] Date of Birth: 05-Apr-1951  Subjective/Objective:                    Action/Plan: Met with patient to discuss discharge needs. Patient is agreeable to home health and has chosen [facility].  Tiffany with [facility] was notified and has accepted the referral for discharge home today.  [facility] was notified of need for youth rolling walker prior to discharge home today.  Bedside RN updated.  Expected Discharge Date:  10/27/14               Expected Discharge Plan:  [place]  In-House Referral:     Discharge planning Services  CM Consult  Post Acute Care Choice:  Durable Medical Equipment, Home Health Choice offered to:  Patient  DME Arranged:  [facility] youth DME Agency:  [place] Arranged:  PT [facility] Agency:  [place]  Status of Service:  Completed, signed off  Medicare Important Message Given:    Date Medicare IM Given:    Medicare IM give by:    Date Additional Medicare IM Given:    Additional Medicare Important Message give by:     If discussed at [place] of Stay Meetings, dates discussed:    Additional Comments:  Rolm Baptise, RN 10/28/2014, 9:35 AM

## 2014-10-28 NOTE — Progress Notes (Signed)
Transported pt to lobby per wc in no obvious distress. dc'd to care of family at door.

## 2014-10-28 NOTE — Progress Notes (Signed)
Physical Therapy Treatment Patient Details Name: Sandi Mealyvelyn W Granato MRN: [id number] DOB: 1950-05-23 Today's Date: 10/28/2014    History of Present Illness 64 y.o. female with spondylolisthesis and SS h/o L5-S1 fusion now s/p hardware removal and L3-4 L4-5 Posterior lumbar interbody fusion.    PT Comments    Patient progressing well. Patient safe to D/C from a mobility standpoint based on progression towards goals set on PT eval.    Follow Up Recommendations  Home health PT;Supervision - Intermittent     Equipment Recommendations  Rolling walker with 5" wheels    Recommendations for Other Services       Precautions / Restrictions Precautions Precautions: Back Precaution Comments: patient able to recall all precautions Required Braces or Orthoses: Spinal Brace Spinal Brace: Lumbar corset;Applied in sitting position    Mobility  Bed Mobility       Sidelying to sit: Supervision     Sit to sidelying: Supervision General bed mobility comments: Patient with safe technique  Transfers Overall transfer level: Modified independent                  Ambulation/Gait Ambulation/Gait assistance: Modified independent (Device/Increase time) Ambulation Distance (Feet): 600 Feet Assistive device: Rolling walker (2 wheeled) Gait Pattern/deviations: Step-through pattern;Decreased stride length   Gait velocity interpretation: at or above normal speed for age/gender General Gait Details: Cues to relax shoulders    Stairs         General stair comments: deferred as she felt comfortable with practice yesterday  Wheelchair Mobility    Modified Rankin (Stroke Patients Only)       Balance                                    Cognition Arousal/Alertness: Awake/alert Behavior During Therapy: WFL for tasks assessed/performed Overall Cognitive Status: Within Functional Limits for tasks assessed                      Exercises      General  Comments        Pertinent Vitals/Pain Pain Score: 4  Pain Location: back Pain Descriptors / Indicators: Sore Pain Intervention(s): Premedicated before session    Home Living                      Prior Function            PT Goals (current goals can now be found in the care plan section) Progress towards PT goals: Progressing toward goals    Frequency  Min 6X/week    PT Plan Current plan remains appropriate    Co-evaluation             End of Session Equipment Utilized During Treatment: Back brace Activity Tolerance: Patient tolerated treatment well Patient left: in bed;with call bell/phone within reach;with family/visitor present     Time: [id number] PT Time Calculation (min) (ACUTE ONLY): 24 min  Charges:  $Gait Training: 8-22 mins $Therapeutic Activity: 8-22 mins                    G Codes:      Fredrich BirksRobinette, Brandn Mcgath Elizabeth 10/28/2014, 10:02 AM

## 2014-10-28 NOTE — Discharge Summary (Signed)
Physician Discharge Summary  Patient ID: Stephanie Kim MRN: [id number] DOB/AGE: 1950-09-20 64 y.o.  Admit date: 10/25/2014 Discharge date: 10/28/2014  Admission Diagnoses: Spondylolisthesis, Lumbar region; Low back pain; Spinal stenosis of lumbar region; Lumbar radiculopathy, back pain   Discharge Diagnoses: Spondylolisthesis, Lumbar region; Low back pain; Spinal stenosis of lumbar region; Lumbar radiculopathy, back pain s/p Exploration of Lumbar five-Sacral one Fusion with removal of previous hardware Lumbar three-four, Lumbar four-five Posterior lumbar interbody fusion (N/A) - Exploration of L5-S1 Fusion with removal of previous hardwar/L3-4 L4-5 Posterior lumbar interbody fusion with PEEK cages, autograft, allograft, pedicle screw fixation L 3 - L 5 levels with posterolateral arthrodesis  Active Problems:   Spondylolisthesis of lumbar region   Discharged Condition: good  Hospital Course: Stephanie Kim was admitted for surgery with dx spondylolisthesis and radiculopathy. Following uncomplicated decompression and fusion, she recovered well and transferred to [facility] for nursing care and therapies.  Consults: None  Significant Diagnostic Studies: radiology: X-Ray: intra-operative  Treatments: surgery: Exploration of Lumbar five-Sacral one Fusion with removal of previous hardware Lumbar three-four, Lumbar four-five Posterior lumbar interbody fusion (N/A) - Exploration of L5-S1 Fusion with removal of previous hardwar/L3-4 L4-5 Posterior lumbar interbody fusion with PEEK cages, autograft, allograft, pedicle screw fixation L 3 - L 5 levels with posterolateral arthrodesis   Discharge Exam: Blood pressure 99/55, pulse 73, temperature 99.3 F (37.4 C), temperature source Oral, resp. rate 16, height 5' 1.5" (1.562 m), weight 56.065 kg (123 lb 9.6 oz), SpO2 94 %. Alert, conversant. MAEW. Good strength BLE. Pt reports small BM yesterday, now without abdominal cramping. She reports decreased lumbar  pain today. No leg pain. Incision beneath honeycomb drsg and Dermabond. No erythema, swelling, or drainage,    Disposition:  Discharge to home with HHPT, rolling walker. Pt verbalizes understanding of d/c instructions and agrees to call office to schedule 3-4 week post-op visit. Rx's Norco 5/325 1-2 po q4-6hrs prn pain #60 & Robaxin 500mg  1 po q8hrs prn spasm #60 to chart.       Medication List    ASK your doctor about these medications        acetaminophen 325 MG tablet  Commonly known as:  TYLENOL  Take 650 mg by mouth every 6 (six) hours as needed (pain).     atorvastatin 10 MG tablet  Commonly known as:  LIPITOR  Take 10 mg by mouth daily.     buPROPion 150 MG 24 hr tablet  Commonly known as:  WELLBUTRIN XL  Take 150 mg by mouth daily.     fluticasone 50 MCG/ACT nasal spray  Commonly known as:  FLONASE  Place 2 sprays into both nostrils daily.     lisinopril 20 MG tablet  Commonly known as:  PRINIVIL,ZESTRIL  Take 20 mg by mouth daily.     metoprolol succinate 25 MG 24 hr tablet  Commonly known as:  TOPROL-XL  Take 25 mg by mouth daily.     OVER THE COUNTER MEDICATION  Take 4 tablets by mouth at bedtime. Herbalax     sertraline 100 MG tablet  Commonly known as:  ZOLOFT  Take 100 mg by mouth daily.         Signed: Georgiann Cockeroteat, Quin Mathenia 10/28/2014, 8:31 AM

## 2014-10-28 NOTE — Progress Notes (Signed)
Subjective: Patient reports "i feel so much better!"  Objective: Vital signs in last 24 hours: Temp:  [98.2 F (36.8 C)-102.3 F (39.1 C)] 99.3 F (37.4 C) (07/08 0504) Pulse Rate:  [67-88] 73 (07/08 0504) Resp:  [14-18] 16 (07/08 0504) BP: (86-103)/(51-68) 99/55 mmHg (07/08 0504) SpO2:  [89 %-99 %] 94 % (07/08 0504)  Intake/Output from previous day:   Intake/Output this shift:    Alert, conversant. MAEW. Good strength BLE. Pt reports small BM yesterday, now without abdominal cramping. She reports decreased lumbar pain today. No leg pain. Incision beneath honeycomb drsg and Dermabond. No erythema, swelling, or drainage,   Lab Results: No results for input(s): WBC, HGB, HCT, PLT in the last 72 hours. BMET No results for input(s): NA, K, CL, CO2, GLUCOSE, BUN, CREATININE, CALCIUM in the last 72 hours.  Studies/Results: No results found.  Assessment/Plan: Improving   LOS: 3 days  Per DrStern, d/c IV, d/c to home with HHPT, rolling walker. Pt verbalizes understanding of d/c instructions and agrees to call office to schedule 3-4 week post-op visit. Rx's Norco 5/325 1-2 po q4-6hrs prn pain #60 & Robaxin 500mg  1 po q8hrs prn spasm #60 to chart.    Georgiann Cockeroteat, Treyson Axel 10/28/2014, 8:26 AM

## 2017-01-28 ENCOUNTER — Other Ambulatory Visit: Payer: Self-pay | Admitting: Physician Assistant

## 2017-01-28 DIAGNOSIS — E2839 Other primary ovarian failure: Secondary | ICD-10-CM

## 2017-02-18 ENCOUNTER — Ambulatory Visit
Admission: RE | Admit: 2017-02-18 | Discharge: 2017-02-18 | Disposition: A | Discharge status: Home / Self Care | Payer: Medicare Other | Source: Ambulatory Visit | Attending: Physician Assistant | Admitting: Physician Assistant

## 2017-02-18 DIAGNOSIS — E2839 Other primary ovarian failure: Secondary | ICD-10-CM

## 2018-06-04 ENCOUNTER — Ambulatory Visit (INDEPENDENT_AMBULATORY_CARE_PROVIDER_SITE_OTHER): Payer: Medicare Other | Admitting: Cardiology

## 2018-06-04 ENCOUNTER — Encounter: Payer: Self-pay | Admitting: Cardiology

## 2018-06-04 VITALS — BP 109/73 | HR 56 | Ht 61.5 in | Wt 120.7 lb

## 2018-06-04 DIAGNOSIS — R05 Cough: Secondary | ICD-10-CM | POA: Diagnosis not present

## 2018-06-04 DIAGNOSIS — E7849 Other hyperlipidemia: Secondary | ICD-10-CM

## 2018-06-04 DIAGNOSIS — R002 Palpitations: Secondary | ICD-10-CM | POA: Diagnosis not present

## 2018-06-04 DIAGNOSIS — R053 Chronic cough: Secondary | ICD-10-CM

## 2018-06-04 DIAGNOSIS — E785 Hyperlipidemia, unspecified: Secondary | ICD-10-CM | POA: Insufficient documentation

## 2018-06-04 DIAGNOSIS — I10 Essential (primary) hypertension: Secondary | ICD-10-CM | POA: Diagnosis not present

## 2018-06-04 DIAGNOSIS — H9311 Tinnitus, right ear: Secondary | ICD-10-CM

## 2018-06-04 NOTE — Progress Notes (Signed)
Subjective:   Stephanie Kim, female    DOB: November 21, 1950, 68 y.o.   MRN: [id number]  Stephanie Kim, [name], PA-C:  Chief Complaint  Patient presents with  . Follow-up    Echo and Monitor results  . Hypertension    HPI   Patient with hypertension, hyperlipidemia, vitamin D deficiency, DDD, recently evaluated by us for palpitations and hypertension. She underwent event monitoring and echo and now presents to discuss results.  Carvedilol was increased to 3 times a day at her last office visit and she is tolerating this well with improvement in BP. She is noted to be on both Metoprolol and Coreg today (did not bring her meds to appt today). She will call our office later today and reconcile her meds.   She does continue to have palpitations described as brief heart racing that occurs with resting. No worsening symtpoms.  Reports history of hyperlipidemia that started in her 720's. Has been well controlled on Lipitor.  Patient has chronic cough that has been present for several years along with congestion and sinus drainage. She states that her mother also had this. Also has ringing in her right ear that is fairly loud when sitting in a quiet room. Reports that she had hearing test performed a few years ago by ENT that was normal.   She is active with maintaining her house and walking her dog, but admits to not actually exercising. She retired as a [employer] 1 year ago.  Past Medical History:  Diagnosis Date  . Anxiety   . Arthritis   . Depression   . Dysrhythmia    palpitations according to her PA  Just comes and goes-placed on Metoprolo  . Fibromyalgia   . Headache    migraine  - last one was this past week  . High cholesterol   . Hypertension   . TMJ (dislocation of temporomandibular joint)    POPS MAINLY    Past Surgical History:  Procedure Laterality Date  . BACK SURGERY     x 4 times  . BUNIONECTOMY     left foot.    bilateral, but she's not sure about right  foot  . EYE SURGERY     lasik  . KNEE ARTHROSCOPY     left  . ROTATOR CUFF REPAIR     cleaned out and removed spur  . TONSILLECTOMY    . TUBAL LIGATION      Family History  Problem Relation Age of Onset  . Hyperlipidemia Mother   . Hypertension Mother   . COPD Mother   . Arthritis Mother   . Heart attack Father   . Hyperlipidemia Father   . Heart attack Brother   . Hyperlipidemia Brother   . Hypertension Brother     Social History   Socioeconomic History  . Marital status: Married    Spouse name: Not on file  . Number of children: Not on file  . Years of education: Not on file  . Highest education level: Not on file  Occupational History  . Not on file  Social Needs  . Financial resource strain: Not on file  . Food insecurity:    Worry: Not on file    Inability: Not on file  . Transportation needs:    Medical: Not on file    Non-medical: Not on file  Tobacco Use  . Smoking status: Never Smoker  . Smokeless tobacco: Never Used  Substance and Sexual Activity  .  Alcohol use: Yes    Alcohol/week: 10.0 standard drinks    Types: 7 Glasses of wine, 3 Shots of liquor per week    Comment: weekly  . Drug use: No  . Sexual activity: Yes    Birth control/protection: Post-menopausal  Lifestyle  . Physical activity:    Days per week: Not on file    Minutes per session: Not on file  . Stress: Not on file  Relationships  . Social connections:    Talks on phone: Not on file    Gets together: Not on file    Attends religious service: Not on file    Active member of club or organization: Not on file    Attends meetings of clubs or organizations: Not on file    Relationship status: Not on file  . Intimate partner violence:    Fear of current or ex partner: Not on file    Emotionally abused: Not on file    Physically abused: Not on file    Forced sexual activity: Not on file  Other Topics Concern  . Not on file  Social History Narrative  . Not on file     Current Meds  Medication Sig  . acetaminophen (TYLENOL) 325 MG tablet Take 650 mg by mouth every 6 (six) hours as needed (pain).  Marland Kitchen. amLODipine (NORVASC) 5 MG tablet Take 1.5 tablets by mouth 2 (two) times daily.  Marland Kitchen. atorvastatin (LIPITOR) 10 MG tablet Take 10 mg by mouth daily.  Marland Kitchen. buPROPion (WELLBUTRIN XL) 150 MG 24 hr tablet Take 150 mg by mouth daily.  . busPIRone (BUSPAR) 7.5 MG tablet Take 1 tablet by mouth daily.  . butalbital-acetaminophen-caffeine (FIORICET, ESGIC) 50-325-40 MG tablet Take 1 tablet by mouth daily.  . Calcium Carbonate-Vitamin D (CALCIUM-VITAMIN D) 600-125 MG-UNIT TABS Take 1 tablet by mouth daily.  . carvedilol (COREG) 6.25 MG tablet Take 1 tablet by mouth daily.  . fluticasone (CUTIVATE) 0.05 % cream Apply 1 application topically as needed.  . fluticasone (FLONASE) 50 MCG/ACT nasal spray Place 2 sprays into both nostrils daily.  Marland Kitchen. lisinopril (PRINIVIL,ZESTRIL) 20 MG tablet Take 20 mg by mouth daily.  Marland Kitchen. lovastatin (MEVACOR) 20 MG tablet Take 1 tablet by mouth daily.  . metoprolol succinate (TOPROL-XL) 25 MG 24 hr tablet Take 25 mg by mouth daily.  . OMEGA 3-6-9 FATTY ACIDS PO Take 1 tablet by mouth daily.  Marland Kitchen. OVER THE COUNTER MEDICATION Take 4 tablets by mouth at bedtime. Herbalax  . sertraline (ZOLOFT) 100 MG tablet Take 100 mg by mouth daily.  . valACYclovir (VALTREX) 500 MG tablet Take 1 tablet by mouth as needed.     Review of Systems  Constitution: Positive for malaise/fatigue. Negative for decreased appetite, weight gain and weight loss.  HENT: Positive for tinnitus.   Eyes: Negative for visual disturbance.  Cardiovascular: Positive for palpitations. Negative for chest pain, claudication, dyspnea on exertion, leg swelling, orthopnea and syncope.  Respiratory: Positive for cough. Negative for hemoptysis and wheezing.   Endocrine: Negative for cold intolerance and heat intolerance.  Hematologic/Lymphatic: Does not bruise/bleed easily.  Skin: Negative for  nail changes.  Musculoskeletal: Negative for muscle weakness and myalgias.  Gastrointestinal: Negative for abdominal pain, change in bowel habit, nausea and vomiting.  Neurological: Negative for difficulty with concentration, dizziness, focal weakness and headaches.  Psychiatric/Behavioral: Negative for altered mental status and suicidal ideas.  All other systems reviewed and are negative.      Objective:     Blood pressure 109/73,  pulse (!) 56, height 5' 1.5" (1.562 m), weight 120 lb 11.2 oz (54.7 kg), SpO2 98 %.  Echocardiogram 05/06/2018: Left ventricle cavity is normal in size. Normal left ventricular shape. Normal global wall motion. Normal diastolic filling pattern. Calculated EF 57%. Mild (Grade I) mitral regurgitation. Mild tricuspid regurgitation.  No evidence of pulmonary hypertension.  14 day event monitor 05/06/2018-05/19/2018: Normal sinus rhythm/sinus bradycardia.  8 patient triggered events. One patient triggered event for rapid/fast heart rate correlated with normal sinus rhythm with 8 beat run of atrial tachycardia. 1 episode of fatigue that correlated with sinus bradycardia at 53 bpm on day at 1602. Other episodes correlated with normal sinus rhythm.  No A. fib or SVT occurred.  Physical Exam  Constitutional: She is oriented to person, place, and time. Vital signs are normal. She appears well-developed and well-nourished.  HENT:  Head: Normocephalic and atraumatic.  Neck: Normal range of motion.  Cardiovascular: Normal rate, regular rhythm, normal heart sounds and intact distal pulses.  Pulmonary/Chest: Effort normal and breath sounds normal. No accessory muscle usage. No respiratory distress.  Abdominal: Soft. Bowel sounds are normal.  Musculoskeletal: Normal range of motion.  Neurological: She is alert and oriented to person, place, and time.  Skin: Skin is warm and dry.  Vitals reviewed.          Assessment & Recommendations:   1. Essential  hypertension Blood pressure has significantly improved with increasing Coreg, will continue the same. Will discontinue Metoprolol if she is on this. She did have one episode of fatigue that correlated with bradycardia. Continue with lisinopril and amlodipine. Echocardiogram was discussed. Essentially normal.   2. Palpitations Noted to have brief atrial tachycardia. Etiology was discussed. Would recommend continuing with beta blocker therapy.   3. Other hyperlipidemia Managed by PCP. Continue with lipitor.  4. Chronic cough Present for several years and unrelated to lisinopril. Possibly related to GERD?  5. Tinnitus of right ear If not evaluated for Meniere's, would recommend this.     She will follow up in 4 weeks to ensure that her blood pressure is still well controlled without Metoprolol.     Altamese Caroleen, FNP-C [facility] Office: [phone] Fax: [phone]

## 2018-06-25 ENCOUNTER — Other Ambulatory Visit: Payer: Self-pay | Admitting: Cardiology

## 2018-07-06 ENCOUNTER — Ambulatory Visit: Payer: PRIVATE HEALTH INSURANCE | Admitting: Cardiology

## 2018-12-25 ENCOUNTER — Other Ambulatory Visit: Payer: Self-pay | Admitting: Physician Assistant

## 2018-12-25 DIAGNOSIS — E2839 Other primary ovarian failure: Secondary | ICD-10-CM

## 2019-01-04 ENCOUNTER — Other Ambulatory Visit: Payer: Self-pay

## 2019-01-04 MED ORDER — CARVEDILOL 6.25 MG PO TABS: 9.3750 mg | ORAL_TABLET | Freq: Two times a day (BID) | ORAL | 2 refills | Status: DC

## 2019-01-20 ENCOUNTER — Other Ambulatory Visit: Payer: Self-pay | Admitting: Orthopaedic Surgery

## 2019-01-27 ENCOUNTER — Other Ambulatory Visit: Payer: Self-pay | Admitting: Orthopaedic Surgery

## 2019-01-27 NOTE — Care Plan (Signed)
Spoke with patient prior to surgery. She plans to discharge to home with family and HHPT. Referral to [facility] at [facility]. Rolling walker ordered for home use.  She will transition to OPPT after follow up with MD. Patient and MD in agreement with plan. Choice offered.   Ladell Heads, [place]

## 2019-01-28 ENCOUNTER — Encounter: Payer: Self-pay | Admitting: Cardiology

## 2019-01-28 NOTE — Patient Instructions (Addendum)
DUE TO COVID-19 ONLY ONE VISITOR IS ALLOWED TO COME WITH YOU AND STAY IN THE WAITING ROOM ONLY DURING PRE OP AND PROCEDURE DAY OF SURGERY. THE 1 VISITOR MAY VISIT WITH YOU AFTER SURGERY IN YOUR PRIVATE ROOM DURING VISITING HOURS ONLY!  YOU NEED TO HAVE A COVID 19 TEST ON__Friday 10/09/2020_____  pm_____, THIS TEST MUST BE DONE BEFORE SURGERY, COME  [address], [place] , [address].  [facility]) ONCE YOUR COVID TEST IS COMPLETED, PLEASE BEGIN THE QUARANTINE INSTRUCTIONS AS OUTLINED IN YOUR HANDOUT.                Stephanie Kim    Your procedure is scheduled on: Tuesday 02/02/2019   Report to [facility] Main  Entrance    Report to admitting at  0800  AM     Call this number if you have problems the morning of surgery [phone]    Remember: Do not eat food  :After Midnight.    NO SOLID FOOD AFTER MIDNIGHT THE NIGHT PRIOR TO SURGERY. NOTHING BY MOUTH EXCEPT CLEAR LIQUIDS UNTIL 0730 am .     PLEASE FINISH ENSURE DRINK PER SURGEON ORDER  WHICH NEEDS TO BE COMPLETED AT 0730 am .   CLEAR LIQUID DIET   Foods Allowed                                                                     Foods Excluded  Coffee and tea, regular and decaf                             liquids that you cannot  Plain Jell-O any favor except red or purple                                           see through such as: Fruit ices (not with fruit pulp)                                     milk, soups, orange juice  Iced Popsicles                                    All solid food Carbonated beverages, regular and diet                                    Cranberry, grape and apple juices Sports drinks like Gatorade Lightly seasoned clear broth or consume(fat free) Sugar, honey syrup  Sample Menu Breakfast                                Lunch  Supper Cranberry juice                    Beef broth                            Chicken  broth Jell-O                                     Grape juice                           Apple juice Coffee or tea                        Jell-O                                      Popsicle                                                Coffee or tea                        Coffee or tea  _____________________________________________________________________      BRUSH YOUR TEETH MORNING OF SURGERY AND RINSE YOUR MOUTH OUT, NO CHEWING GUM CANDY OR MINTS.     Take these medicines the morning of surgery with A SIP OF WATER: Buspirone (Buspar), Bupropion (Wellbutrin XL), Sertraline (Zoloft),  Carvedilol (Coreg), Amlodipine (Norvasc)                                 You may not have any metal on your body including hair pins and              piercings  Do not wear jewelry, make-up, lotions, powders or perfumes, deodorant             Do not wear nail polish on your fingernails.  Do not shave  48 hours prior to surgery.                Do not bring valuables to the hospital. [facility] IS NOT             RESPONSIBLE   FOR VALUABLES.  Contacts, dentures or bridgework may not be worn into surgery.  Leave suitcase in the car. After surgery it may be brought to your room.                  Please read over the following fact sheets you were given: _____________________________________________________________________             [facility] Pre[facility] Before surgery, you can play an important role.  Because skin is not sterile, your skin needs to be as free of germs as possible.  You can reduce the number of germs on your skin by washing with CHG (chlorahexidine gluconate) soap before surgery.  CHG is an antiseptic cleaner which kills germs and bonds with the skin to continue killing germs even after washing. Please DO NOT use if  you have an allergy to CHG or antibacterial soaps.  If your skin becomes reddened/irritated stop using the CHG and inform your nurse when you arrive at  Short Stay. Do not shave (including legs and underarms) for at least 48 hours prior to the first CHG shower.  You may shave your face/neck. Please follow these instructions carefully:  1.  Shower with CHG Soap the night before surgery and the  morning of Surgery.  2.  If you choose to wash your hair, wash your hair first as usual with your  normal  shampoo.  3.  After you shampoo, rinse your hair and body thoroughly to remove the  shampoo.                           4.  Use CHG as you would any other liquid soap.  You can apply chg directly  to the skin and wash                       Gently with a scrungie or clean washcloth.  5.  Apply the CHG Soap to your body ONLY FROM THE NECK DOWN.   Do not use on face/ open                           Wound or open sores. Avoid contact with eyes, ears mouth and genitals (private parts).                       Wash face,  Genitals (private parts) with your normal soap.             6.  Wash thoroughly, paying special attention to the area where your surgery  will be performed.  7.  Thoroughly rinse your body with warm water from the neck down.  8.  DO NOT shower/wash with your normal soap after using and rinsing off  the CHG Soap.                9.  Pat yourself dry with a clean towel.            10.  Wear clean pajamas.            11.  Place clean sheets on your bed the night of your first shower and do not  sleep with pets. Day of Surgery : Do not apply any lotions/deodorants the morning of surgery.  Please wear clean clothes to the hospital/surgery center.  FAILURE TO FOLLOW THESE INSTRUCTIONS MAY RESULT IN THE CANCELLATION OF YOUR SURGERY PATIENT SIGNATURE_________________________________  NURSE SIGNATURE__________________________________  ________________________________________________________________________   Stephanie Kim  An incentive spirometer is a tool that can help keep your lungs clear and active. This tool measures how well you are  filling your lungs with each breath. Taking long deep breaths may help reverse or decrease the chance of developing breathing (pulmonary) problems (especially infection) following:  A long period of time when you are unable to move or be active. BEFORE THE PROCEDURE   If the spirometer includes an indicator to show your best effort, your nurse or respiratory therapist will set it to a desired goal.  If possible, sit up straight or lean slightly forward. Try not to slouch.  Hold the incentive spirometer in an upright position. INSTRUCTIONS FOR USE  1. Sit on the edge of your bed if possible, or sit  up as far as you can in bed or on a chair. 2. Hold the incentive spirometer in an upright position. 3. Breathe out normally. 4. Place the mouthpiece in your mouth and seal your lips tightly around it. 5. Breathe in slowly and as deeply as possible, raising the piston or the ball toward the top of the column. 6. Hold your breath for 3-5 seconds or for as long as possible. Allow the piston or ball to fall to the bottom of the column. 7. Remove the mouthpiece from your mouth and breathe out normally. 8. Rest for a few seconds and repeat Steps 1 through 7 at least 10 times every 1-2 hours when you are awake. Take your time and take a few normal breaths between deep breaths. 9. The spirometer may include an indicator to show your best effort. Use the indicator as a goal to work toward during each repetition. 10. After each set of 10 deep breaths, practice coughing to be sure your lungs are clear. If you have an incision (the cut made at the time of surgery), support your incision when coughing by placing a pillow or rolled up towels firmly against it. Once you are able to get out of bed, walk around indoors and cough well. You may stop using the incentive spirometer when instructed by your caregiver.  RISKS AND COMPLICATIONS  Take your time so you do not get dizzy or light-headed.  If you are in pain,  you may need to take or ask for pain medication before doing incentive spirometry. It is harder to take a deep breath if you are having pain. AFTER USE  Rest and breathe slowly and easily.  It can be helpful to keep track of a log of your progress. Your caregiver can provide you with a simple table to help with this. If you are using the spirometer at home, follow these instructions: SEEK MEDICAL CARE IF:   You are having difficultly using the spirometer.  You have trouble using the spirometer as often as instructed.  Your pain medication is not giving enough relief while using the spirometer.  You develop fever of 100.5 F (38.1 C) or higher. SEEK IMMEDIATE MEDICAL CARE IF:   You cough up bloody sputum that had not been present before.  You develop fever of 102 F (38.9 C) or greater.  You develop worsening pain at or near the incision site. MAKE SURE YOU:   Understand these instructions.  Will watch your condition.  Will get help right away if you are not doing well or get worse. Document Released: 08/19/2006 Document Revised: 07/01/2011 Document Reviewed: 10/20/2006 Exit[employer] Patient Information 2014 Exit[employer], [place].   ________________________________________________________________________  WHAT IS A BLOOD TRANSFUSION? Blood Transfusion Information  A transfusion is the replacement of blood or some of its parts. Blood is made up of multiple cells which provide different functions.  Red blood cells carry oxygen and are used for blood loss replacement.  White blood cells fight against infection.  Platelets control bleeding.  Plasma helps clot blood.  Other blood products are available for specialized needs, such as hemophilia or other clotting disorders. BEFORE THE TRANSFUSION  Who gives blood for transfusions?   Healthy volunteers who are fully evaluated to make sure their blood is safe. This is blood bank blood. Transfusion therapy is the safest it has ever  been in the practice of medicine. Before blood is taken from a donor, a complete history is taken to make sure that person has  no history of diseases nor engages in risky social behavior (examples are intravenous drug use or sexual activity with multiple partners). The donor's travel history is screened to minimize risk of transmitting infections, such as malaria. The donated blood is tested for signs of infectious diseases, such as HIV and hepatitis. The blood is then tested to be sure it is compatible with you in order to minimize the chance of a transfusion reaction. If you or a relative donates blood, this is often done in anticipation of surgery and is not appropriate for emergency situations. It takes many days to process the donated blood. RISKS AND COMPLICATIONS Although transfusion therapy is very safe and saves many lives, the main dangers of transfusion include:   Getting an infectious disease.  Developing a transfusion reaction. This is an allergic reaction to something in the blood you were given. Every precaution is taken to prevent this. The decision to have a blood transfusion has been considered carefully by your caregiver before blood is given. Blood is not given unless the benefits outweigh the risks. AFTER THE TRANSFUSION  Right after receiving a blood transfusion, you will usually feel much better and more energetic. This is especially true if your red blood cells have gotten low (anemic). The transfusion raises the level of the red blood cells which carry oxygen, and this usually causes an energy increase.  The nurse administering the transfusion will monitor you carefully for complications. HOME CARE INSTRUCTIONS  No special instructions are needed after a transfusion. You may find your energy is better. Speak with your caregiver about any limitations on activity for underlying diseases you may have. SEEK MEDICAL CARE IF:   Your condition is not improving after your  transfusion.  You develop redness or irritation at the intravenous (IV) site. SEEK IMMEDIATE MEDICAL CARE IF:  Any of the following symptoms occur over the next 12 hours:  Shaking chills.  You have a temperature by mouth above 102 F (38.9 C), not controlled by medicine.  Chest, back, or muscle pain.  People around you feel you are not acting correctly or are confused.  Shortness of breath or difficulty breathing.  Dizziness and fainting.  You get a rash or develop hives.  You have a decrease in urine output.  Your urine turns a dark color or changes to pink, red, or brown. Any of the following symptoms occur over the next 10 days:  You have a temperature by mouth above 102 F (38.9 C), not controlled by medicine.  Shortness of breath.  Weakness after normal activity.  The white part of the eye turns yellow (jaundice).  You have a decrease in the amount of urine or are urinating less often.  Your urine turns a dark color or changes to pink, red, or brown. Document Released: 04/05/2000 Document Revised: 07/01/2011 Document Reviewed: 11/23/2007 [facility] Patient Information 2014 [place], [place].  _______________________________________________________________________

## 2019-01-28 NOTE — Progress Notes (Signed)
Surgical clearance letter sent to Dr. Rhona Raider. She has not had stress testing; however, normal echo, normal EKG, asymptomatic and good functional capacity when we saw her last in Feb 2020. Low risk procedure.

## 2019-01-29 ENCOUNTER — Encounter (HOSPITAL_COMMUNITY): Payer: Self-pay

## 2019-01-29 ENCOUNTER — Ambulatory Visit (HOSPITAL_COMMUNITY)
Admission: RE | Admit: 2019-01-29 | Discharge: 2019-01-29 | Disposition: A | Discharge status: Home / Self Care | Payer: Medicare Other | Source: Ambulatory Visit | Attending: Orthopaedic Surgery | Admitting: Orthopaedic Surgery

## 2019-01-29 ENCOUNTER — Encounter (HOSPITAL_COMMUNITY)
Admission: RE | Admit: 2019-01-29 | Discharge: 2019-01-29 | Disposition: A | Discharge status: Home / Self Care | Payer: Medicare Other | Source: Ambulatory Visit | Attending: Orthopaedic Surgery | Admitting: Orthopaedic Surgery

## 2019-01-29 ENCOUNTER — Other Ambulatory Visit: Payer: Self-pay

## 2019-01-29 ENCOUNTER — Other Ambulatory Visit (HOSPITAL_COMMUNITY)
Admission: RE | Admit: 2019-01-29 | Discharge: 2019-01-29 | Disposition: A | Discharge status: Home / Self Care | Payer: Medicare Other | Source: Ambulatory Visit | Attending: Orthopaedic Surgery | Admitting: Orthopaedic Surgery

## 2019-01-29 DIAGNOSIS — M1612 Unilateral primary osteoarthritis, left hip: Secondary | ICD-10-CM | POA: Insufficient documentation

## 2019-01-29 DIAGNOSIS — Z01818 Encounter for other preprocedural examination: Secondary | ICD-10-CM | POA: Diagnosis not present

## 2019-01-29 DIAGNOSIS — Z20828 Contact with and (suspected) exposure to other viral communicable diseases: Secondary | ICD-10-CM | POA: Insufficient documentation

## 2019-01-29 LAB — SURGICAL PCR SCREEN
MRSA, PCR: NEGATIVE
Staphylococcus aureus: NEGATIVE

## 2019-01-29 LAB — CBC WITH DIFFERENTIAL/PLATELET
Abs Immature Granulocytes: 0.01 10*3/uL (ref 0.00–0.07)
Basophils Absolute: 0.1 10*3/uL (ref 0.0–0.1)
Basophils Relative: 1 %
Eosinophils Absolute: 0.2 10*3/uL (ref 0.0–0.5)
Eosinophils Relative: 2 %
HCT: 41.9 % (ref 36.0–46.0)
Hemoglobin: 13.1 g/dL (ref 12.0–15.0)
Immature Granulocytes: 0 %
Lymphocytes Relative: 29 %
Lymphs Abs: 2.2 10*3/uL (ref 0.7–4.0)
MCH: 31.4 pg (ref 26.0–34.0)
MCHC: 31.3 g/dL (ref 30.0–36.0)
MCV: 100.5 fL — ABNORMAL HIGH (ref 80.0–100.0)
Monocytes Absolute: 0.7 10*3/uL (ref 0.1–1.0)
Monocytes Relative: 9 %
Neutro Abs: 4.6 10*3/uL (ref 1.7–7.7)
Neutrophils Relative %: 59 %
Platelets: 284 10*3/uL (ref 150–400)
RBC: 4.17 MIL/uL (ref 3.87–5.11)
RDW: 12.2 % (ref 11.5–15.5)
WBC: 7.8 10*3/uL (ref 4.0–10.5)
nRBC: 0 % (ref 0.0–0.2)

## 2019-01-29 LAB — URINALYSIS, ROUTINE W REFLEX MICROSCOPIC
Bilirubin Urine: NEGATIVE
Glucose, UA: NEGATIVE mg/dL
Hgb urine dipstick: NEGATIVE
Ketones, ur: NEGATIVE mg/dL
Leukocytes,Ua: NEGATIVE
Nitrite: NEGATIVE
Protein, ur: NEGATIVE mg/dL
Specific Gravity, Urine: 1.021 (ref 1.005–1.030)
pH: 5 (ref 5.0–8.0)

## 2019-01-29 LAB — BASIC METABOLIC PANEL
Anion gap: 9 (ref 5–15)
BUN: 21 mg/dL (ref 8–23)
CO2: 28 mmol/L (ref 22–32)
Calcium: 9.2 mg/dL (ref 8.9–10.3)
Chloride: 103 mmol/L (ref 98–111)
Creatinine, Ser: 0.94 mg/dL (ref 0.44–1.00)
GFR calc Af Amer: >60 mL/min (ref >60)
GFR calc non Af Amer: >60 mL/min (ref >60)
Glucose, Bld: 116 mg/dL — ABNORMAL HIGH (ref 70–99)
Potassium: 4.1 mmol/L (ref 3.5–5.1)
Sodium: 140 mmol/L (ref 135–145)

## 2019-01-29 LAB — PROTIME-INR
INR: 0.9 (ref 0.8–1.2)
Prothrombin Time: 12.4 seconds (ref 11.4–15.2)

## 2019-01-29 LAB — APTT: aPTT: 26 seconds (ref 24–36)

## 2019-01-29 NOTE — Progress Notes (Addendum)
PCP - Domingo Pulse. [place], [place]  Saw her on 01/25/2019  Cardiologist - Dr. Vista Deck, [facility]  Saw Kathrine Haddock  Saw Cardiologist for palpitations and was referred to him from [place], wore holter monitor for 2 weeks,   Chest x-ray - none EKG - 04/13/2018 on chart from [facility] Stress Test -none  ECHO - 05/06/2018 on chart Cardiac Cath -none   Sleep Study - none CPAP - none  Fasting Blood Sugar - none Checks Blood Sugar _____ times a day  Blood Thinner Instructions:none Aspirin Instructions:none Last Dose:  Anesthesia review:  Chart given to Konrad Felix, Pa for review!   Patient has history with HTN, palpitations  Patient denies shortness of breath, fever, cough and chest pain at PAT appointment   Patient verbalized understanding of instructions that were given to them at the PAT appointment. Patient was also instructed that they will need to review over the PAT instructions again at home before surgery.

## 2019-01-30 LAB — ABO/RH: ABO/RH(D): O POS

## 2019-02-01 LAB — NOVEL CORONAVIRUS, NAA (HOSP ORDER, SEND-OUT TO REF LAB; TAT 18-24 HRS): SARS-CoV-2, NAA: NOT DETECTED

## 2019-02-01 MED ORDER — BUPIVACAINE LIPOSOME 1.3 % IJ SUSP
10.0000 mL | Freq: Once | INTRAMUSCULAR | Status: DC
  Filled 2019-02-01: qty 10

## 2019-02-01 MED ORDER — TRANEXAMIC ACID 1000 MG/10ML IV SOLN
2000.0000 mg | INTRAVENOUS | Status: DC
  Filled 2019-02-01: qty 20

## 2019-02-01 NOTE — H&P (Signed)
TOTAL HIP ADMISSION H&P  Patient is admitted for left total hip arthroplasty.  Subjective:  Chief Complaint: left hip pain  HPI: Stephanie Kim, 68 y.o. female, has a history of pain and functional disability in the left hip(s) due to arthritis and patient has failed non-surgical conservative treatments for greater than 12 weeks to include NSAID's and/or analgesics, corticosteriod injections, viscosupplementation injections, flexibility and strengthening excercises, use of assistive devices, weight reduction as appropriate and activity modification.  Onset of symptoms was gradual starting 5 years ago with gradually worsening course since that time.The patient noted no past surgery on the left hip(s).  Patient currently rates pain in the left hip at 10 out of 10 with activity. Patient has night pain, worsening of pain with activity and weight bearing, trendelenberg gait, pain that interfers with activities of daily living, crepitus and joint swelling. Patient has evidence of subchondral cysts, subchondral sclerosis, periarticular osteophytes and joint space narrowing by imaging studies. This condition presents safety issues increasing the risk of falls. There is no current active infection.  Patient Active Problem List   Diagnosis Date Noted  . Palpitations 06/04/2018  . Hyperlipidemia 06/04/2018  . Spondylolisthesis of lumbar region 10/25/2014   Past Medical History:  Diagnosis Date  . Anxiety   . Arthritis   . Depression   . Dysrhythmia    palpitations according to her PA  Just comes and goes-placed on Metoprolo  . Fibromyalgia   . Headache    migraine  - last one was this past week  . High cholesterol   . Hypertension   . TMJ (dislocation of temporomandibular joint)    POPS MAINLY    Past Surgical History:  Procedure Laterality Date  . BACK SURGERY     x 4 times  . BUNIONECTOMY     left foot.    bilateral, but she's not sure about right foot  . EYE SURGERY     lasik  .  KNEE ARTHROSCOPY     left  . ROTATOR CUFF REPAIR     cleaned out and removed spur  . TONSILLECTOMY    . TUBAL LIGATION      Current Facility-Administered Medications  Medication Dose Route Frequency Provider Last Rate Last Dose  . [START ON 02/02/2019] bupivacaine liposome (EXPAREL) 1.3 % injection 133 mg  10 mL Other Once Melrose Nakayama, MD      . Derrill Memo ON 02/02/2019] tranexamic acid (CYKLOKAPRON) 2,000 mg in sodium chloride 0.9 % 50 mL Topical Application  9,798 mg Topical To OR Melrose Nakayama, MD       Current Outpatient Medications  Medication Sig Dispense Refill Last Dose  . acetaminophen (TYLENOL) 325 MG tablet Take 650 mg by mouth every 6 (six) hours as needed (pain).     Marland Kitchen amLODipine (NORVASC) 5 MG tablet Take 5 mg by mouth daily.      Marland Kitchen atorvastatin (LIPITOR) 10 MG tablet Take 10 mg by mouth at bedtime.      Marland Kitchen buPROPion (WELLBUTRIN XL) 300 MG 24 hr tablet Take 300 mg by mouth daily.      . busPIRone (BUSPAR) 7.5 MG tablet Take 11.25 mg by mouth 2 (two) times daily.      . butalbital-acetaminophen-caffeine (FIORICET, ESGIC) 50-325-40 MG tablet Take 1 tablet by mouth every 6 (six) hours as needed for headache.      . Calcium Carbonate-Vitamin D (CALCIUM-VITAMIN D) 600-125 MG-UNIT TABS Take 1 tablet by mouth 2 (two) times daily.      Marland Kitchen  carvedilol (COREG) 6.25 MG tablet Take 1.5 tablets (9.375 mg total) by mouth 2 (two) times daily. 180 tablet 2   . fluticasone (CUTIVATE) 0.05 % cream Apply 1 application topically daily as needed (irritation).      . fluticasone (FLONASE) 50 MCG/ACT nasal spray Place 2 sprays into both nostrils daily.     Marland Kitchen. lisinopril (PRINIVIL,ZESTRIL) 20 MG tablet Take 20 mg by mouth daily.     . Misc Natural Products (NARCOSOFT HERBAL LAX) CAPS Take 2 capsules by mouth every evening.     . sertraline (ZOLOFT) 100 MG tablet Take 100 mg by mouth daily.     . valACYclovir (VALTREX) 500 MG tablet Take 1 tablet by mouth as needed.      Allergies  Allergen  Reactions  . Atenolol Swelling    Throat swells Throat swells   . Oxycodone Itching  . Alendronate Sodium Swelling    Other reaction(s): Other Felt like got stuck throat Other reaction(s): Other Felt like got stuck throat  Throat swelling.   . Aspirin Swelling and Other (See Comments)    Also metallic taste    . Levofloxacin Other (See Comments)    Blurred vision Blurred vision   . Moxifloxacin Other (See Comments)    Blurred vision Blurred vision   . Amoxicillin-Pot Clavulanate Photosensitivity and Other (See Comments)    Blurred vision Blurred vision Blurred vision     Social History   Tobacco Use  . Smoking status: Never Smoker  . Smokeless tobacco: Never Used  Substance Use Topics  . Alcohol use: Yes    Alcohol/week: 10.0 standard drinks    Types: 7 Glasses of wine, 3 Shots of liquor per week    Comment: weekly    Family History  Problem Relation Age of Onset  . Hyperlipidemia Mother   . Hypertension Mother   . COPD Mother   . Arthritis Mother   . Heart attack Father   . Hyperlipidemia Father   . Heart attack Brother   . Hyperlipidemia Brother   . Hypertension Brother      Review of Systems  Musculoskeletal: Positive for joint pain.       Left hip  All other systems reviewed and are negative.   Objective:  Physical Exam  Constitutional: She is oriented to person, place, and time. She appears well-developed and well-nourished.  HENT:  Head: Normocephalic and atraumatic.  Eyes: Pupils are equal, round, and reactive to light.  Neck: Normal range of motion.  Cardiovascular: Normal rate and regular rhythm.  Respiratory: Effort normal.  GI: Soft.  Musculoskeletal:     Comments: Left hip is extremely painful in internal rotation.  Leg lengths look roughly equal.  Sensation and motor function are intact distally and she has palpable pulses in her feet.  She has a midline scar about her back which is benign.    Neurological: She is alert and  oriented to person, place, and time.  Skin: Skin is warm and dry.  Psychiatric: She has a normal mood and affect. Her behavior is normal. Judgment and thought content normal.    Vital signs in last 24 hours:    Labs:   Estimated body mass index is 23.68 kg/m as calculated from the following:   Height as of 01/29/19: 5\' 1"  (1.549 m).   Weight as of 01/29/19: 56.8 kg.   Imaging Review Plain radiographs demonstrate severe degenerative joint disease of the left hip(s). The bone quality appears to be good for age and  reported activity level.      Assessment/Plan:  End stage primary arthritis, left hip(s)  The patient history, physical examination, clinical judgement of the provider and imaging studies are consistent with end stage degenerative joint disease of the left hip(s) and total hip arthroplasty is deemed medically necessary. The treatment options including medical management, injection therapy, arthroscopy and arthroplasty were discussed at length. The risks and benefits of total hip arthroplasty were presented and reviewed. The risks due to aseptic loosening, infection, stiffness, dislocation/subluxation,  thromboembolic complications and other imponderables were discussed.  The patient acknowledged the explanation, agreed to proceed with the plan and consent was signed. Patient is being admitted for inpatient treatment for surgery, pain control, PT, OT, prophylactic antibiotics, VTE prophylaxis, progressive ambulation and ADL's and discharge planning.The patient is planning to be discharged home with home health services

## 2019-02-01 NOTE — Anesthesia Preprocedure Evaluation (Addendum)
Anesthesia Evaluation  Patient identified by MRN, date of birth, ID band Patient awake    Reviewed: Allergy & Precautions, NPO status , Patient's Chart, lab work & pertinent test results  Airway Mallampati: I       Dental no notable dental hx. (+) Teeth Intact   Pulmonary neg pulmonary ROS,    Pulmonary exam normal breath sounds clear to auscultation       Cardiovascular hypertension, Pt. on medications Normal cardiovascular exam Rhythm:Regular Rate:Normal     Neuro/Psych  Headaches, PSYCHIATRIC DISORDERS Anxiety Depression    GI/Hepatic negative GI ROS, Neg liver ROS,   Endo/Other  negative endocrine ROS  Renal/GU negative Renal ROS  negative genitourinary   Musculoskeletal DJD   Abdominal Normal abdominal exam  (+)   Peds  Hematology negative hematology ROS (+)   Anesthesia Other Findings   Reproductive/Obstetrics                            Anesthesia Physical Anesthesia Plan  ASA: II  Anesthesia Plan: Spinal   Post-op Pain Management:    Induction:   PONV Risk Score and Plan: 2 and Ondansetron and Dexamethasone  Airway Management Planned: Natural Airway, Nasal Cannula and Simple Face Mask  Additional Equipment: None  Intra-op Plan:   Post-operative Plan:   Informed Consent: I have reviewed the patients History and Physical, chart, labs and discussed the procedure including the risks, benefits and alternatives for the proposed anesthesia with the patient or authorized representative who has indicated his/her understanding and acceptance.       Plan Discussed with: CRNA  Anesthesia Plan Comments: (Per cardiology 01/28/2019, "Surgical clearance letter sent to Dr. Rhona Raider. She has not had stress testing; however, normal echo, normal EKG, asymptomatic and good functional capacity when we saw her last in Feb 2020. Low risk procedure.")       Anesthesia Quick  Evaluation

## 2019-02-02 ENCOUNTER — Encounter (HOSPITAL_COMMUNITY)
Admission: RE | Disposition: A | Discharge status: Home / Self Care | Payer: Self-pay | Source: Home / Self Care | Attending: Orthopaedic Surgery

## 2019-02-02 ENCOUNTER — Other Ambulatory Visit: Payer: Self-pay

## 2019-02-02 ENCOUNTER — Inpatient Hospital Stay (HOSPITAL_COMMUNITY): Payer: Medicare Other | Admitting: Certified Registered"

## 2019-02-02 ENCOUNTER — Observation Stay (HOSPITAL_COMMUNITY)
Admission: RE | Admit: 2019-02-02 | Discharge: 2019-02-03 | Disposition: A | Discharge status: Home / Self Care | Payer: Medicare Other | Attending: Orthopaedic Surgery | Admitting: Orthopaedic Surgery

## 2019-02-02 ENCOUNTER — Inpatient Hospital Stay (HOSPITAL_COMMUNITY): Payer: Medicare Other | Admitting: Physician Assistant

## 2019-02-02 ENCOUNTER — Encounter (HOSPITAL_COMMUNITY): Payer: Self-pay | Admitting: Emergency Medicine

## 2019-02-02 ENCOUNTER — Inpatient Hospital Stay (HOSPITAL_COMMUNITY): Payer: Medicare Other

## 2019-02-02 DIAGNOSIS — M1612 Unilateral primary osteoarthritis, left hip: Secondary | ICD-10-CM | POA: Diagnosis not present

## 2019-02-02 DIAGNOSIS — F419 Anxiety disorder, unspecified: Secondary | ICD-10-CM | POA: Diagnosis not present

## 2019-02-02 DIAGNOSIS — I10 Essential (primary) hypertension: Secondary | ICD-10-CM | POA: Insufficient documentation

## 2019-02-02 DIAGNOSIS — E7800 Pure hypercholesterolemia, unspecified: Secondary | ICD-10-CM | POA: Diagnosis not present

## 2019-02-02 DIAGNOSIS — Z419 Encounter for procedure for purposes other than remedying health state, unspecified: Secondary | ICD-10-CM

## 2019-02-02 DIAGNOSIS — Z79899 Other long term (current) drug therapy: Secondary | ICD-10-CM | POA: Insufficient documentation

## 2019-02-02 DIAGNOSIS — F329 Major depressive disorder, single episode, unspecified: Secondary | ICD-10-CM | POA: Insufficient documentation

## 2019-02-02 HISTORY — PX: TOTAL HIP ARTHROPLASTY: SHX124

## 2019-02-02 LAB — TYPE AND SCREEN
ABO/RH(D): O POS
Antibody Screen: NEGATIVE

## 2019-02-02 SURGERY — ARTHROPLASTY, HIP, TOTAL, ANTERIOR APPROACH
Anesthesia: Spinal | Site: Hip | Laterality: Left

## 2019-02-02 MED ORDER — STERILE WATER FOR IRRIGATION IR SOLN
Status: DC | PRN
  Administered 2019-02-02 (×2): 1000 mL

## 2019-02-02 MED ORDER — HYDROCODONE-ACETAMINOPHEN 7.5-325 MG PO TABS
1.0000 | ORAL_TABLET | ORAL | Status: DC | PRN
  Administered 2019-02-02: 1 via ORAL
  Filled 2019-02-02: qty 1

## 2019-02-02 MED ORDER — TRANEXAMIC ACID-NACL 1000-0.7 MG/100ML-% IV SOLN
1000.0000 mg | INTRAVENOUS | Status: AC
  Administered 2019-02-02: 1000 mg via INTRAVENOUS
  Filled 2019-02-02: qty 100

## 2019-02-02 MED ORDER — BISACODYL 5 MG PO TBEC: 5.0000 mg | DELAYED_RELEASE_TABLET | Freq: Every day | ORAL | Status: DC | PRN

## 2019-02-02 MED ORDER — BUTALBITAL-APAP-CAFFEINE 50-325-40 MG PO TABS: 1.0000 | ORAL_TABLET | Freq: Four times a day (QID) | ORAL | Status: DC | PRN

## 2019-02-02 MED ORDER — PROPOFOL 500 MG/50ML IV EMUL
INTRAVENOUS | Status: AC
  Filled 2019-02-02: qty 50

## 2019-02-02 MED ORDER — CHLORHEXIDINE GLUCONATE 4 % EX LIQD: 60.0000 mL | Freq: Once | CUTANEOUS | Status: DC

## 2019-02-02 MED ORDER — POVIDONE-IODINE 10 % EX SWAB
2.0000 "application " | Freq: Once | CUTANEOUS | Status: AC
  Administered 2019-02-02: 2 via TOPICAL

## 2019-02-02 MED ORDER — FENTANYL CITRATE (PF) 100 MCG/2ML IJ SOLN
INTRAMUSCULAR | Status: AC
  Filled 2019-02-02: qty 2

## 2019-02-02 MED ORDER — BUPIVACAINE-EPINEPHRINE (PF) 0.5% -1:200000 IJ SOLN
INTRAMUSCULAR | Status: DC | PRN
  Administered 2019-02-02: 30 mL

## 2019-02-02 MED ORDER — ACETAMINOPHEN 500 MG PO TABS
500.0000 mg | ORAL_TABLET | Freq: Four times a day (QID) | ORAL | Status: AC
  Administered 2019-02-02 – 2019-02-03 (×3): 500 mg via ORAL
  Filled 2019-02-02 (×3): qty 1

## 2019-02-02 MED ORDER — DIPHENHYDRAMINE HCL 12.5 MG/5ML PO ELIX
12.5000 mg | ORAL_SOLUTION | ORAL | Status: DC | PRN
  Administered 2019-02-02: 25 mg via ORAL
  Filled 2019-02-02: qty 10

## 2019-02-02 MED ORDER — 0.9 % SODIUM CHLORIDE (POUR BTL) OPTIME
TOPICAL | Status: DC | PRN
  Administered 2019-02-02: 1000 mL

## 2019-02-02 MED ORDER — METHOCARBAMOL 500 MG IVPB - SIMPLE MED
500.0000 mg | Freq: Four times a day (QID) | INTRAVENOUS | Status: DC | PRN
  Filled 2019-02-02: qty 50

## 2019-02-02 MED ORDER — FENTANYL CITRATE (PF) 100 MCG/2ML IJ SOLN
INTRAMUSCULAR | Status: DC | PRN
  Administered 2019-02-02: 50 ug via INTRAVENOUS

## 2019-02-02 MED ORDER — EPHEDRINE SULFATE-NACL 50-0.9 MG/10ML-% IV SOSY
PREFILLED_SYRINGE | INTRAVENOUS | Status: DC | PRN
  Administered 2019-02-02: 5 mg via INTRAVENOUS
  Administered 2019-02-02 (×3): 10 mg via INTRAVENOUS

## 2019-02-02 MED ORDER — ONDANSETRON HCL 4 MG/2ML IJ SOLN
INTRAMUSCULAR | Status: DC | PRN
  Administered 2019-02-02: 4 mg via INTRAVENOUS

## 2019-02-02 MED ORDER — DEXAMETHASONE SODIUM PHOSPHATE 10 MG/ML IJ SOLN
INTRAMUSCULAR | Status: AC
  Filled 2019-02-02: qty 1

## 2019-02-02 MED ORDER — PROPOFOL 500 MG/50ML IV EMUL
INTRAVENOUS | Status: DC | PRN
  Administered 2019-02-02: 75 ug/kg/min via INTRAVENOUS

## 2019-02-02 MED ORDER — SODIUM CHLORIDE 0.9 % IV SOLN
INTRAVENOUS | Status: DC | PRN
  Administered 2019-02-02: 40 ug/min via INTRAVENOUS

## 2019-02-02 MED ORDER — SERTRALINE HCL 100 MG PO TABS: 100.0000 mg | ORAL_TABLET | Freq: Every day | ORAL | Status: DC

## 2019-02-02 MED ORDER — BUPROPION HCL ER (XL) 300 MG PO TB24: 300.0000 mg | ORAL_TABLET | Freq: Every day | ORAL | Status: DC

## 2019-02-02 MED ORDER — MIDAZOLAM HCL 5 MG/5ML IJ SOLN
INTRAMUSCULAR | Status: DC | PRN
  Administered 2019-02-02: 2 mg via INTRAVENOUS

## 2019-02-02 MED ORDER — PHENYLEPHRINE HCL (PRESSORS) 10 MG/ML IV SOLN
INTRAVENOUS | Status: AC
  Filled 2019-02-02: qty 1

## 2019-02-02 MED ORDER — VANCOMYCIN HCL IN DEXTROSE 1-5 GM/200ML-% IV SOLN
1000.0000 mg | Freq: Two times a day (BID) | INTRAVENOUS | Status: AC
  Administered 2019-02-02: 1000 mg via INTRAVENOUS
  Filled 2019-02-02: qty 200

## 2019-02-02 MED ORDER — BUSPIRONE HCL 7.5 MG PO TABS
11.2500 mg | ORAL_TABLET | Freq: Two times a day (BID) | ORAL | Status: DC
  Filled 2019-02-02 (×2): qty 2

## 2019-02-02 MED ORDER — BUPIVACAINE LIPOSOME 1.3 % IJ SUSP
INTRAMUSCULAR | Status: DC | PRN
  Administered 2019-02-02: 10 mL

## 2019-02-02 MED ORDER — ATORVASTATIN CALCIUM 10 MG PO TABS
10.0000 mg | ORAL_TABLET | Freq: Every day | ORAL | Status: DC
  Administered 2019-02-02: 10 mg via ORAL
  Filled 2019-02-02: qty 1

## 2019-02-02 MED ORDER — DEXAMETHASONE SODIUM PHOSPHATE 10 MG/ML IJ SOLN
INTRAMUSCULAR | Status: DC | PRN
  Administered 2019-02-02: 5 mg via INTRAVENOUS

## 2019-02-02 MED ORDER — APIXABAN 2.5 MG PO TABS
2.5000 mg | ORAL_TABLET | Freq: Two times a day (BID) | ORAL | Status: DC
  Administered 2019-02-03: 2.5 mg via ORAL
  Filled 2019-02-02: qty 1

## 2019-02-02 MED ORDER — ONDANSETRON HCL 4 MG/2ML IJ SOLN
INTRAMUSCULAR | Status: AC
  Filled 2019-02-02: qty 2

## 2019-02-02 MED ORDER — BUPIVACAINE HCL (PF) 0.75 % IJ SOLN
INTRAMUSCULAR | Status: DC | PRN
  Administered 2019-02-02: 1.4 mL via INTRATHECAL

## 2019-02-02 MED ORDER — MIDAZOLAM HCL 2 MG/2ML IJ SOLN
INTRAMUSCULAR | Status: AC
  Filled 2019-02-02: qty 2

## 2019-02-02 MED ORDER — LISINOPRIL 20 MG PO TABS: 20.0000 mg | ORAL_TABLET | Freq: Every day | ORAL | Status: DC

## 2019-02-02 MED ORDER — DOCUSATE SODIUM 100 MG PO CAPS
100.0000 mg | ORAL_CAPSULE | Freq: Two times a day (BID) | ORAL | Status: DC
  Administered 2019-02-02: 100 mg via ORAL
  Filled 2019-02-02: qty 1

## 2019-02-02 MED ORDER — ONDANSETRON HCL 4 MG PO TABS: 4.0000 mg | ORAL_TABLET | Freq: Four times a day (QID) | ORAL | Status: DC | PRN

## 2019-02-02 MED ORDER — METOCLOPRAMIDE HCL 5 MG PO TABS: 5.0000 mg | ORAL_TABLET | Freq: Three times a day (TID) | ORAL | Status: DC | PRN

## 2019-02-02 MED ORDER — PHENOL 1.4 % MT LIQD
1.0000 | OROMUCOSAL | Status: DC | PRN
  Filled 2019-02-02: qty 177

## 2019-02-02 MED ORDER — BUPIVACAINE-EPINEPHRINE 0.5% -1:200000 IJ SOLN
INTRAMUSCULAR | Status: AC
  Filled 2019-02-02: qty 1

## 2019-02-02 MED ORDER — LACTATED RINGERS IV SOLN
INTRAVENOUS | Status: DC
  Administered 2019-02-02: 20:00:00 via INTRAVENOUS

## 2019-02-02 MED ORDER — PHENYLEPHRINE 40 MCG/ML (10ML) SYRINGE FOR IV PUSH (FOR BLOOD PRESSURE SUPPORT)
PREFILLED_SYRINGE | INTRAVENOUS | Status: AC
  Filled 2019-02-02: qty 10

## 2019-02-02 MED ORDER — EPHEDRINE 5 MG/ML INJ
INTRAVENOUS | Status: AC
  Filled 2019-02-02: qty 10

## 2019-02-02 MED ORDER — TRANEXAMIC ACID-NACL 1000-0.7 MG/100ML-% IV SOLN
1000.0000 mg | Freq: Once | INTRAVENOUS | Status: AC
  Administered 2019-02-02: 1000 mg via INTRAVENOUS
  Filled 2019-02-02: qty 100

## 2019-02-02 MED ORDER — MORPHINE SULFATE (PF) 2 MG/ML IV SOLN
0.5000 mg | INTRAVENOUS | Status: DC | PRN
  Administered 2019-02-02: 1 mg via INTRAVENOUS
  Filled 2019-02-02: qty 1

## 2019-02-02 MED ORDER — HYDROCODONE-ACETAMINOPHEN 5-325 MG PO TABS
1.0000 | ORAL_TABLET | ORAL | Status: DC | PRN
  Administered 2019-02-02 – 2019-02-03 (×2): 2 via ORAL
  Administered 2019-02-03: 1 via ORAL
  Filled 2019-02-02 (×2): qty 2
  Filled 2019-02-02: qty 1

## 2019-02-02 MED ORDER — PHENYLEPHRINE 40 MCG/ML (10ML) SYRINGE FOR IV PUSH (FOR BLOOD PRESSURE SUPPORT)
PREFILLED_SYRINGE | INTRAVENOUS | Status: DC | PRN
  Administered 2019-02-02: 80 ug via INTRAVENOUS

## 2019-02-02 MED ORDER — AMLODIPINE BESYLATE 5 MG PO TABS: 5.0000 mg | ORAL_TABLET | Freq: Every day | ORAL | Status: DC

## 2019-02-02 MED ORDER — LACTATED RINGERS IV SOLN
INTRAVENOUS | Status: DC
  Administered 2019-02-02 (×2): via INTRAVENOUS

## 2019-02-02 MED ORDER — MENTHOL 3 MG MT LOZG: 1.0000 | LOZENGE | OROMUCOSAL | Status: DC | PRN

## 2019-02-02 MED ORDER — PROPOFOL 10 MG/ML IV BOLUS
INTRAVENOUS | Status: DC | PRN
  Administered 2019-02-02: 20 mg via INTRAVENOUS

## 2019-02-02 MED ORDER — CARVEDILOL 6.25 MG PO TABS
9.3750 mg | ORAL_TABLET | Freq: Two times a day (BID) | ORAL | Status: DC
  Filled 2019-02-02 (×2): qty 1

## 2019-02-02 MED ORDER — ALUM & MAG HYDROXIDE-SIMETH 200-200-20 MG/5ML PO SUSP: 30.0000 mL | ORAL | Status: DC | PRN

## 2019-02-02 MED ORDER — METHOCARBAMOL 500 MG PO TABS
500.0000 mg | ORAL_TABLET | Freq: Four times a day (QID) | ORAL | Status: DC | PRN
  Administered 2019-02-02 – 2019-02-03 (×2): 500 mg via ORAL
  Filled 2019-02-02 (×2): qty 1

## 2019-02-02 MED ORDER — PROPOFOL 10 MG/ML IV BOLUS
INTRAVENOUS | Status: AC
  Filled 2019-02-02: qty 20

## 2019-02-02 MED ORDER — METOCLOPRAMIDE HCL 5 MG/ML IJ SOLN: 5.0000 mg | Freq: Three times a day (TID) | INTRAMUSCULAR | Status: DC | PRN

## 2019-02-02 MED ORDER — VANCOMYCIN HCL IN DEXTROSE 1-5 GM/200ML-% IV SOLN
1000.0000 mg | INTRAVENOUS | Status: AC
  Administered 2019-02-02: 1000 mg via INTRAVENOUS
  Filled 2019-02-02: qty 200

## 2019-02-02 MED ORDER — ACETAMINOPHEN 325 MG PO TABS: 325.0000 mg | ORAL_TABLET | Freq: Four times a day (QID) | ORAL | Status: DC | PRN

## 2019-02-02 MED ORDER — TRANEXAMIC ACID 1000 MG/10ML IV SOLN
INTRAVENOUS | Status: DC | PRN
  Administered 2019-02-02: 2000 mg via TOPICAL

## 2019-02-02 MED ORDER — ONDANSETRON HCL 4 MG/2ML IJ SOLN: 4.0000 mg | Freq: Four times a day (QID) | INTRAMUSCULAR | Status: DC | PRN

## 2019-02-02 SURGICAL SUPPLY — 44 items
BAG DECANTER FOR FLEXI CONT (MISCELLANEOUS) ×3 IMPLANT
BLADE SAW SGTL 18X1.27X75 (BLADE) ×2 IMPLANT
BLADE SAW SGTL 18X1.27X75MM (BLADE) ×1
BOOTIES KNEE HIGH SLOAN (MISCELLANEOUS) ×3 IMPLANT
CELLS DAT CNTRL 66122 CELL SVR (MISCELLANEOUS) ×1 IMPLANT
COVER PERINEAL POST (MISCELLANEOUS) ×3 IMPLANT
COVER SURGICAL LIGHT HANDLE (MISCELLANEOUS) ×3 IMPLANT
COVER WAND RF STERILE (DRAPES) IMPLANT
CUP GRIPTON 48MM 100 HIP (Hips) ×2 IMPLANT
DECANTER SPIKE VIAL GLASS SM (MISCELLANEOUS) ×3 IMPLANT
DRAPE IMP U-DRAPE 54X76 (DRAPES) ×3 IMPLANT
DRAPE STERI IOBAN 125X83 (DRAPES) ×3 IMPLANT
DRAPE U-SHAPE 47X51 STRL (DRAPES) ×6 IMPLANT
DRSG AQUACEL AG ADV 3.5X 6 (GAUZE/BANDAGES/DRESSINGS) ×3 IMPLANT
DURAPREP 26ML APPLICATOR (WOUND CARE) ×3 IMPLANT
ELECT BLADE TIP CTD 4 INCH (ELECTRODE) ×3 IMPLANT
ELECT REM PT RETURN 15FT ADLT (MISCELLANEOUS) ×3 IMPLANT
ELIMINATOR HOLE APEX DEPUY (Hips) ×2 IMPLANT
GLOVE BIO SURGEON STRL SZ8 (GLOVE) ×6 IMPLANT
GLOVE BIOGEL PI IND STRL 8 (GLOVE) ×2 IMPLANT
GLOVE BIOGEL PI INDICATOR 8 (GLOVE) ×4
GOWN STRL REUS W/TWL XL LVL3 (GOWN DISPOSABLE) ×6 IMPLANT
HEAD FEMORAL 32 CERAMIC (Hips) ×2 IMPLANT
HOLDER FOLEY CATH W/STRAP (MISCELLANEOUS) ×3 IMPLANT
KIT TURNOVER KIT A (KITS) IMPLANT
LINER ACET 32X48 (Liner) ×2 IMPLANT
MANIFOLD NEPTUNE II (INSTRUMENTS) ×3 IMPLANT
NEEDLE HYPO 22GX1.5 SAFETY (NEEDLE) ×3 IMPLANT
NS IRRIG 1000ML POUR BTL (IV SOLUTION) ×3 IMPLANT
PACK ANTERIOR HIP CUSTOM (KITS) ×3 IMPLANT
PROTECTOR NERVE ULNAR (MISCELLANEOUS) ×3 IMPLANT
RETRACTOR WND ALEXIS 18 MED (MISCELLANEOUS) ×1 IMPLANT
RTRCTR WOUND ALEXIS 18CM MED (MISCELLANEOUS) ×3
STEM FEMORAL SZ 6MM STD ACTIS (Stem) ×2 IMPLANT
SUT ETHIBOND NAB CT1 #1 30IN (SUTURE) ×6 IMPLANT
SUT VIC AB 1 CT1 36 (SUTURE) ×3 IMPLANT
SUT VIC AB 2-0 CT1 27 (SUTURE) ×3
SUT VIC AB 2-0 CT1 TAPERPNT 27 (SUTURE) ×1 IMPLANT
SUT VIC AB 3-0 PS2 18 (SUTURE) ×3
SUT VIC AB 3-0 PS2 18XBRD (SUTURE) ×1 IMPLANT
SUT VLOC 180 0 24IN GS25 (SUTURE) ×3 IMPLANT
SYR 50ML LL SCALE MARK (SYRINGE) ×3 IMPLANT
TRAY FOLEY MTR SLVR 16FR STAT (SET/KITS/TRAYS/PACK) ×3 IMPLANT
YANKAUER SUCT BULB TIP 10FT TU (MISCELLANEOUS) ×3 IMPLANT

## 2019-02-02 NOTE — Care Plan (Signed)
Ortho Bundle Case Management Note  Patient Details  Name: Stephanie Kim MRN: [id number] Date of Birth: 1950-12-14  Spoke with patient prior to surgery. She plans to discharge to home with family and HHPT. Referral to [facility]. Rolling walker ordered for home use.  She will transition to OPPT after follow up with MD. Patient and MD in agreement with plan. Choice offered.                    DME Arranged:  [place] Rile rolling DME Agency:  [employer]  HH Arranged:  PT [place] Agency:  [facility] (formerly [facility])  Additional Comments: Please contact me with any questions of if this plan should need to change.  Ladell Heads,  [place] [facility]opaedic Specialist  [phone] 02/02/2019, 8:48 AM

## 2019-02-02 NOTE — Op Note (Signed)
PRE-OP DIAGNOSIS:  LEFT HIP DEGENERATIVE JOINT DISEASE POST-OP DIAGNOSIS: same PROCEDURE:  LEFT TOTAL HIP ARTHROPLASTY ANTERIOR APPROACH ANESTHESIA:  Spinal and MAC SURGEON:  Melrose Nakayama MD ASSISTANT:  Loni Dolly PA-C   INDICATIONS FOR PROCEDURE:  The patient is a 68 y.o. female with a long history of a painful hip.  This has persisted despite multiple conservative measures.  The patient has persisted with pain and dysfunction making rest and activity difficult.  A total hip replacement is offered as surgical treatment.  Informed operative consent was obtained after discussion of possible complications including reaction to anesthesia, infection, neurovascular injury, dislocation, DVT, PE, and death.  The importance of the postoperative rehab program to optimize result was stressed with the patient.  SUMMARY OF FINDINGS AND PROCEDURE:  Under the above anesthesia through a anterior approach an the Hana table a left THR was performed.  The patient had severe degenerative change and good bone quality.  We used DePuy components to replace the hip and these were size 6 standard Actis femur capped with a +1 68m ceramic hip ball.  On the acetabular side we used a size 48 Gription shell with a plus 0 neutral polyethylene liner.  We did use a hole eliminator.  ALoni DollyPA-C assisted throughout and was invaluable to the completion of the case in that he helped position and retract while I performed the procedure.  He also closed simultaneously to help minimize OR time.  I used fluoroscopy throughout the case to check position of components and leg lengths and read all these views myself.  DESCRIPTION OF PROCEDURE:  The patient was taken to the OR suite where the above anesthetic was applied.  The patient was then positioned on the Hana table supine.  All bony prominences were appropriately padded.  Prep and drape was then performed in normal sterile fashion.  The patient was given vancomycin preoperative  antibiotic and an appropriate time out was performed.  We then took an anterior approach to the left hip.  Dissection was taken through adipose to the tensor fascia lata fascia.  This structure was incised longitudinally and we dissected in the intermuscular interval just medial to this muscle.  Cobra retractors were placed superior and inferior to the femoral neck superficial to the capsule.  A capsular incision was then made and the retractors were placed along the femoral neck.  Xray was brought in to get a good level for the femoral neck cut which was made with an oscillating saw and osteotome.  The femoral head was removed with a corkscrew.  The acetabulum was exposed and some labral tissues were excised. Reaming was taken to the inside wall of the pelvis and sequentially up to 1 mm smaller than the actual component.  A trial of components was done and then the aforementioned acetabular shell was placed in appropriate tilt and anteversion confirmed by fluoroscopy. The liner was placed along with the hole eliminator and attention was turned to the femur.  The leg was brought down and over into adduction and the elevator bar was used to raise the femur up gently in the wound.  The piriformis was released with care taken to preserve the obturator internus attachment and all of the posterior capsule. The femur was reamed and then broached to the appropriate size.  A trial reduction was done and the aforementioned head and neck assembly gave uKoreathe best stability in extension with external rotation.  Leg lengths were felt to be about equal by  fluoroscopic exam.  The trial components were removed and the wound irrigated.  We then placed the femoral component in appropriate anteversion.  The head was applied to a dry stem neck and the hip again reduced.  It was again stable in the aforementioned position.  The would was irrigated again followed by re-approximation of anterior capsule with ethibond suture. Tensor  fascia was repaired with V-loc suture  followed by deep closure with #O and #2 undyed vicryl.  Skin was closed with subQ stitch and steristrips followed by a sterile dressing.  EBL and IOF can be obtained from anesthesia records.  DISPOSITION:  The patient was extubated in the OR and taken to PACU in stable condition to be admitted to the Orthopedic Surgery for appropriate post-op care to include perioperative antibiotics and DVT prophylaxis.

## 2019-02-02 NOTE — Transfer of Care (Signed)
Immediate Anesthesia Transfer of Care Note  Patient: Stephanie Kim  Procedure(s) Performed: LEFT  HIP ARTHROPLASTY ANTERIOR APPROACH (Left Hip)  Patient Location: PACU  Anesthesia Type:Spinal  Level of Consciousness: awake, alert , oriented and patient cooperative  Airway & Oxygen Therapy: Patient Spontanous Breathing and Patient connected to face mask  Post-op Assessment: Report given to RN and Post -op Vital signs reviewed and stable  Post vital signs: Reviewed and stable  Last Vitals:  Vitals Value Taken Time  BP 77/55 02/02/19 1146  Temp    Pulse 66 02/02/19 1148  Resp 17 02/02/19 1148  SpO2 100 % 02/02/19 1148  Vitals shown include unvalidated device data.  Last Pain:  Vitals:   02/02/19 0828  TempSrc: Oral  PainSc:       Patients Stated Pain Goal: 4 (06/03/15 3567)  Complications: No apparent anesthesia complications

## 2019-02-02 NOTE — Anesthesia Procedure Notes (Signed)
Spinal  Patient location during procedure: OR Start time: 02/02/2019 10:05 AM End time: 02/02/2019 10:09 AM Staffing Resident/CRNA: Claudia Desanctis, CRNA Performed: resident/CRNA  Preanesthetic Checklist Completed: patient identified, site marked, surgical consent, pre-op evaluation, timeout performed, IV checked, risks and benefits discussed and monitors and equipment checked Spinal Block Patient position: sitting Prep: DuraPrep Patient monitoring: heart rate, cardiac monitor, continuous pulse ox and blood pressure Approach: midline Location: L2-3 Injection technique: single-shot Needle Needle type: Pencan  Needle gauge: 24 G Needle length: 10 cm Needle insertion depth: 7 cm Assessment Sensory level: T4

## 2019-02-02 NOTE — Anesthesia Postprocedure Evaluation (Signed)
Anesthesia Post Note  Patient: Stephanie Kim  Procedure(s) Performed: LEFT  HIP ARTHROPLASTY ANTERIOR APPROACH (Left Hip)     Patient location during evaluation: PACU Anesthesia Type: Spinal Pain management: pain level controlled Vital Signs Assessment: post-procedure vital signs reviewed and stable Respiratory status: spontaneous breathing Cardiovascular status: stable Postop Assessment: no headache, no backache, spinal receding, patient able to bend at knees and no apparent nausea or vomiting Anesthetic complications: no    Last Vitals:  Vitals:   02/02/19 1230 02/02/19 1256  BP: 110/70 99/68  Pulse: (!) 59 62  Resp: 12 11  Temp:    SpO2: 99% 100%    Last Pain:  Vitals:   02/02/19 1230  TempSrc:   PainSc: 0-No pain   Pain Goal: Patients Stated Pain Goal: 4 (02/02/19 0826)  LLE Motor Response: Purposeful movement, Responds to commands (02/02/19 1230) LLE Sensation: Decreased, Numbness (02/02/19 1230) RLE Motor Response: Purposeful movement, Responds to commands (02/02/19 1230) RLE Sensation: Decreased, Numbness (02/02/19 1230) L Sensory Level: L4-Anterior knee, lower leg (02/02/19 1230) R Sensory Level: L4-Anterior knee, lower leg (02/02/19 1230) Epidural/Spinal Function Cutaneous sensation: Able to Discern Pressure (02/02/19 1230), Patient able to flex knees: Yes (02/02/19 1230), Patient able to lift hips off bed: No (02/02/19 1230), Back pain beyond tenderness at insertion site: No (02/02/19 1230), Progressively worsening motor and/or sensory loss: No (02/02/19 1230), Bowel and/or bladder incontinence post epidural: No (02/02/19 1230)  Huston Foley

## 2019-02-02 NOTE — Interval H&P Note (Signed)
History and Physical Interval Note:  02/02/2019 9:08 AM  Stephanie Kim  has presented today for surgery, with the diagnosis of LEFT HIP DEGENERATIVE JOINT DISEASE.  The various methods of treatment have been discussed with the patient and family. After consideration of risks, benefits and other options for treatment, the patient has consented to  Procedure(s): [place] (Left) as a surgical intervention.  The patient's history has been reviewed, patient examined, no change in status, stable for surgery.  I have reviewed the patient's chart and labs.  Questions were answered to the patient's satisfaction.     Hessie Dibble

## 2019-02-02 NOTE — Evaluation (Signed)
Physical Therapy Evaluation Patient Details Name: Stephanie Kim MRN: [id number] DOB: 07-29-1950 Today's Date: 02/02/2019   History of Present Illness  L DA-THA; h/o multiple back surgeries  Clinical Impression  Pt is s/p THA resulting in the deficits listed below (see PT Problem List). Pt ambulated 42' with RW, no loss of balance. Initiated THA HEP. Good progress expected.  Pt will benefit from skilled PT to increase their independence and safety with mobility to allow discharge to the venue listed below.      Follow Up Recommendations Follow surgeon's recommendation for DC plan and follow-up therapies    Equipment Recommendations  Rolling walker with 5" wheels;3in1 (PT)    Recommendations for Other Services       Precautions / Restrictions Precautions Precautions: Fall Restrictions Weight Bearing Restrictions: No Other Position/Activity Restrictions: WBAT      Mobility  Bed Mobility Overal bed mobility: Needs Assistance Bed Mobility: Supine to Sit     Supine to sit: Min assist     General bed mobility comments: assist to support and advance LLE to edge of bed  Transfers Overall transfer level: Needs assistance Equipment used: Rolling walker (2 wheeled) Transfers: Sit to/from Stand Sit to Stand: Min assist;From elevated surface         General transfer comment: VCs hand placement, min A to rise  Ambulation/Gait Ambulation/Gait assistance: Min guard Gait Distance (Feet): 45 Feet Assistive device: Rolling walker (2 wheeled) Gait Pattern/deviations: Step-to pattern;Decreased stance time - left;Decreased stride length;Antalgic Gait velocity: decr   General Gait Details: steady, no loss of balance  Stairs            Wheelchair Mobility    Modified Rankin (Stroke Patients Only)       Balance Overall balance assessment: Modified Independent                                           Pertinent Vitals/Pain Pain Assessment:  0-10 Pain Score: 5  Pain Location: L hip Pain Descriptors / Indicators: Sore Pain Intervention(s): Limited activity within patient's tolerance;Monitored during session;Premedicated before session;Ice applied;Patient requesting pain meds-RN notified    Home Living Family/patient expects to be discharged to:: Private residence Living Arrangements: Spouse/significant other Available Help at Discharge: Family;Available 24 hours/day Type of Home: House Home Access: Stairs to enter Entrance Stairs-Rails: Can reach both;Left;Right Entrance Stairs-Number of Steps: 6 Home Layout: One level Home Equipment: Cane - single point      Prior Function Level of Independence: Independent with assistive device(s)         Comments: used SPC when going outside     Hand Dominance        Extremity/Trunk Assessment   Upper Extremity Assessment Upper Extremity Assessment: Overall WFL for tasks assessed    Lower Extremity Assessment Lower Extremity Assessment: LLE deficits/detail LLE Deficits / Details: knee ext 3/5, hip ~+2/5 limited by pain, hip AAROM decr ~50% 2* pain LLE Sensation: WNL    Cervical / Trunk Assessment Cervical / Trunk Assessment: Normal  Communication   Communication: No difficulties  Cognition Arousal/Alertness: Awake/alert Behavior During Therapy: WFL for tasks assessed/performed Overall Cognitive Status: Within Functional Limits for tasks assessed  General Comments      Exercises Total Joint Exercises Ankle Circles/Pumps: AROM;Both;10 reps;Supine Heel Slides: AAROM;Left;5 reps;Supine Hip ABduction/ADduction: AAROM;Left;5 reps;Supine   Assessment/Plan    PT Assessment Patient needs continued PT services  PT Problem List Decreased strength;Decreased range of motion;Decreased activity tolerance;Decreased knowledge of use of DME;Decreased mobility;Pain       PT Treatment Interventions DME  instruction;Gait training;Stair training;Functional mobility training;Therapeutic exercise;Therapeutic activities;Patient/family education    PT Goals (Current goals can be found in the Care Plan section)  Acute Rehab PT Goals Patient Stated Goal: yardwork, housekeeping PT Goal Formulation: With patient Time For Goal Achievement: 02/09/19 Potential to Achieve Goals: Good    Frequency 7X/week   Barriers to discharge        Co-evaluation               AM-PAC PT "6 Clicks" Mobility  Outcome Measure Help needed turning from your back to your side while in a flat bed without using bedrails?: A Little Help needed moving from lying on your back to sitting on the side of a flat bed without using bedrails?: A Little Help needed moving to and from a bed to a chair (including a wheelchair)?: A Little Help needed standing up from a chair using your arms (e.g., wheelchair or bedside chair)?: A Little Help needed to walk in hospital room?: A Little Help needed climbing 3-5 steps with a railing? : A Lot 6 Click Score: 17    End of Session Equipment Utilized During Treatment: Gait belt Activity Tolerance: Patient tolerated treatment well Patient left: in chair;with call bell/phone within reach;with nursing/sitter in room Nurse Communication: Mobility status;Patient requests pain meds PT Visit Diagnosis: Difficulty in walking, not elsewhere classified (R26.2);Pain Pain - Right/Left: Left Pain - part of body: Hip    Time: 6967-8938 PT Time Calculation (min) (ACUTE ONLY): 28 min   Charges:   PT Evaluation $PT Eval Low Complexity: 1 Low PT Treatments $Gait Training: 8-22 mins        Ralene Bathe Kistler PT 02/02/2019  Acute Rehabilitation Services Pager [phone] Office [phone]

## 2019-02-03 ENCOUNTER — Encounter (HOSPITAL_COMMUNITY): Payer: Self-pay | Admitting: Orthopaedic Surgery

## 2019-02-03 DIAGNOSIS — M1612 Unilateral primary osteoarthritis, left hip: Secondary | ICD-10-CM | POA: Diagnosis not present

## 2019-02-03 MED ORDER — BUSPIRONE HCL 5 MG PO TABS: 10.0000 mg | ORAL_TABLET | Freq: Two times a day (BID) | ORAL | Status: DC

## 2019-02-03 MED ORDER — HYDROCODONE-ACETAMINOPHEN 5-325 MG PO TABS: 1.0000 | ORAL_TABLET | Freq: Four times a day (QID) | ORAL | 0 refills | Status: DC | PRN

## 2019-02-03 MED ORDER — APIXABAN 2.5 MG PO TABS: 2.5000 mg | ORAL_TABLET | Freq: Two times a day (BID) | ORAL | 0 refills | Status: DC

## 2019-02-03 MED ORDER — BUSPIRONE HCL 5 MG PO TABS: 2.5000 mg | ORAL_TABLET | Freq: Every day | ORAL | Status: DC

## 2019-02-03 MED ORDER — TIZANIDINE HCL 4 MG PO TABS: 2.0000 mg | ORAL_TABLET | Freq: Four times a day (QID) | ORAL | 1 refills | Status: AC | PRN

## 2019-02-03 NOTE — Discharge Instructions (Signed)
Information on my medicine - ELIQUIS (apixaban)  This medication education was reviewed with me or my healthcare representative as part of my discharge preparation.  The student pharmacist that spoke with me during my hospital stay was:  Bobbye Riggs, Student-PharmD  Why was Eliquis prescribed for you? Eliquis was prescribed for you to reduce the risk of blood clots forming after orthopedic surgery.    What do You need to know about Eliquis? Take your Eliquis TWICE DAILY - one tablet in the morning and one tablet in the evening with or without food.  It would be best to take the dose about the same time each day.  If you have difficulty swallowing the tablet whole please discuss with your pharmacist how to take the medication safely.  Take Eliquis exactly as prescribed by your doctor and DO NOT stop taking Eliquis without talking to the doctor who prescribed the medication.  Stopping without other medication to take the place of Eliquis may increase your risk of developing a clot.  After discharge, you should have regular check-up appointments with your healthcare provider that is prescribing your Eliquis.  What do you do if you miss a dose? If a dose of ELIQUIS is not taken at the scheduled time, take it as soon as possible on the same day and twice-daily administration should be resumed.  The dose should not be doubled to make up for a missed dose.  Do not take more than one tablet of ELIQUIS at the same time.  Important Safety Information A possible side effect of Eliquis is bleeding. You should call your healthcare provider right away if you experience any of the following: ? Bleeding from an injury or your nose that does not stop. ? Unusual colored urine (red or dark brown) or unusual colored stools (red or black). ? Unusual bruising for unknown reasons. ? A serious fall or if you hit your head (even if there is no bleeding).  Some medicines may interact with Eliquis and  might increase your risk of bleeding or clotting while on Eliquis. To help avoid this, consult your healthcare provider or pharmacist prior to using any new prescription or non-prescription medications, including herbals, vitamins, non-steroidal anti-inflammatory drugs (NSAIDs) and supplements.  This website has more information on Eliquis (apixaban): http://[email or web address].com/eliquis/home

## 2019-02-03 NOTE — Progress Notes (Signed)
Subjective: 1 Day Post-Op Procedure(s) (LRB): LEFT  HIP ARTHROPLASTY ANTERIOR APPROACH (Left)   Patient feels well and is hoping to go home today.  Activity level:  wbat Diet tolerance:  ok Voiding:  Foley out this morning Patient reports pain as mild.    Objective: Vital signs in last 24 hours: Temp:  [97 F (36.1 C)-98.1 F (36.7 C)] 98.1 F (36.7 C) (10/14 0513) Pulse Rate:  [58-78] 78 (10/14 0513) Resp:  [11-18] 16 (10/14 0513) BP: (77-111)/(55-72) 95/68 (10/14 0513) SpO2:  [94 %-100 %] 97 % (10/14 0513) Weight:  [56.8 kg] 56.8 kg (10/13 0826)  Labs: No results for input(s): HGB in the last 72 hours. No results for input(s): WBC, RBC, HCT, PLT in the last 72 hours. No results for input(s): NA, K, CL, CO2, BUN, CREATININE, GLUCOSE, CALCIUM in the last 72 hours. No results for input(s): LABPT, INR in the last 72 hours.  Physical Exam:  Neurologically intact ABD soft Neurovascular intact Sensation intact distally Intact pulses distally Dorsiflexion/Plantar flexion intact Incision: dressing C/D/I and no drainage No cellulitis present Compartment soft  Assessment/Plan:  1 Day Post-Op Procedure(s) (LRB): LEFT  HIP ARTHROPLASTY ANTERIOR APPROACH (Left) Advance diet Up with therapy D/C IV fluids Discharge home with home health today after PT. Continue on Eliquis 2.5mg  BID x 4 weeks post op. Follow up in office 2 weeks post op.  Stephanie Kim 02/03/2019, 7:43 AM

## 2019-02-03 NOTE — Progress Notes (Signed)
Physical Therapy Treatment Patient Details Name: Stephanie Kim MRN: [id number] DOB: Aug 24, 1950 Today's Date: 02/03/2019    History of Present Illness L DA-THA; h/o multiple back surgeries    PT Comments    Pt is progressing well with mobility and is ready to DC from PT standpoint. She ambulated 200' with RW, completed stair training, and demonstrates good understanding of HEP.   Follow Up Recommendations  Follow surgeon's recommendation for DC plan and follow-up therapies     Equipment Recommendations  Rolling walker with 5" wheels;3in1 (PT)    Recommendations for Other Services       Precautions / Restrictions Precautions Precautions: Fall Restrictions Weight Bearing Restrictions: No Other Position/Activity Restrictions: WBAT    Mobility  Bed Mobility Overal bed mobility: Needs Assistance Bed Mobility: Supine to Sit     Supine to sit: Supervision     General bed mobility comments: instructed pt to self assist LLE with RLE  Transfers Overall transfer level: Needs assistance Equipment used: Rolling walker (2 wheeled) Transfers: Sit to/from Stand Sit to Stand: Supervision         General transfer comment: VCs hand placement  Ambulation/Gait Ambulation/Gait assistance: Supervision Gait Distance (Feet): 200 Feet Assistive device: Rolling walker (2 wheeled) Gait Pattern/deviations: Step-to pattern;Decreased stance time - left;Decreased stride length;Antalgic;Step-through pattern Gait velocity: decr   General Gait Details: steady, no loss of balance   Stairs Stairs: Yes Stairs assistance: Min guard Stair Management: Two rails;Forwards;Step to pattern Number of Stairs: 6 General stair comments: VCs sequencing   Wheelchair Mobility    Modified Rankin (Stroke Patients Only)       Balance                                            Cognition Arousal/Alertness: Awake/alert Behavior During Therapy: WFL for tasks  assessed/performed Overall Cognitive Status: Within Functional Limits for tasks assessed                                        Exercises Total Joint Exercises Ankle Circles/Pumps: AROM;Both;10 reps;Supine Quad Sets: AROM;Left;Supine;5 reps Short Arc Quad: AROM;Left;10 reps;Supine Heel Slides: AAROM;Left;5 reps;Supine Hip ABduction/ADduction: AAROM;Left;5 reps;Supine Long Arc Quad: AROM;Left;10 reps;Seated    General Comments        Pertinent Vitals/Pain Pain Score: 5  Pain Location: L hip Pain Descriptors / Indicators: Sore Pain Intervention(s): Limited activity within patient's tolerance;Monitored during session;Premedicated before session;Ice applied    Home Living                      Prior Function            PT Goals (current goals can now be found in the care plan section) Acute Rehab PT Goals Patient Stated Goal: yardwork, housekeeping PT Goal Formulation: With patient Time For Goal Achievement: 02/09/19 Potential to Achieve Goals: Good Progress towards PT goals: Progressing toward goals    Frequency    7X/week      PT Plan Current plan remains appropriate    Co-evaluation              AM-PAC PT "6 Clicks" Mobility   Outcome Measure  Help needed turning from your back to your side while in a flat bed without using bedrails?: None Help needed  moving from lying on your back to sitting on the side of a flat bed without using bedrails?: None Help needed moving to and from a bed to a chair (including a wheelchair)?: None Help needed standing up from a chair using your arms (e.g., wheelchair or bedside chair)?: None Help needed to walk in hospital room?: None Help needed climbing 3-5 steps with a railing? : A Little 6 Click Score: 23    End of Session Equipment Utilized During Treatment: Gait belt Activity Tolerance: Patient tolerated treatment well Patient left: in chair;with call bell/phone within reach Nurse  Communication: Mobility status PT Visit Diagnosis: Difficulty in walking, not elsewhere classified (R26.2);Pain Pain - Right/Left: Left Pain - part of body: Hip     Time: 3474-2595 PT Time Calculation (min) (ACUTE ONLY): 28 min  Charges:  $Gait Training: 8-22 mins $Therapeutic Exercise: 8-22 mins                     Blondell Reveal Kistler PT 02/03/2019  Acute Rehabilitation Services Pager [phone] Office [phone]

## 2019-02-03 NOTE — Discharge Summary (Signed)
Patient ID: Stephanie Kim MRN: [id number] DOB/AGE: October 16, 1950 68 y.o.  Admit date: 02/02/2019 Discharge date: 02/03/2019  Admission Diagnoses:  Principal Problem:   Primary localized osteoarthritis of left hip Active Problems:   Primary osteoarthritis of left hip   Discharge Diagnoses:  Same  Past Medical History:  Diagnosis Date  . Anxiety   . Arthritis   . Depression   . Dysrhythmia    palpitations according to her PA  Just comes and goes-placed on Metoprolo  . Fibromyalgia   . Headache    migraine  - last one was this past week  . High cholesterol   . Hypertension   . TMJ (dislocation of temporomandibular joint)    POPS MAINLY    Surgeries: Procedure(s): LEFT  HIP ARTHROPLASTY ANTERIOR APPROACH on 02/02/2019   Consultants:   Discharged Condition: Improved  Hospital Course: Stephanie Kim is an 68 y.o. female who was admitted 02/02/2019 for operative treatment ofPrimary localized osteoarthritis of left hip. Patient has severe unremitting pain that affects sleep, daily activities, and work/hobbies. After pre-op clearance the patient was taken to the operating room on 02/02/2019 and underwent  Procedure(s): LEFT  HIP ARTHROPLASTY ANTERIOR APPROACH.    Patient was given perioperative antibiotics:  Anti-infectives (From admission, onward)   Start     Dose/Rate Route Frequency Ordered Stop   02/02/19 2130  vancomycin (VANCOCIN) IVPB 1000 mg/200 mL premix     1,000 mg 200 mL/hr over 60 Minutes Intravenous Every 12 hours 02/02/19 1414 02/02/19 2114   02/02/19 0815  vancomycin (VANCOCIN) IVPB 1000 mg/200 mL premix     1,000 mg 200 mL/hr over 60 Minutes Intravenous On call to O.R. 02/02/19 0808 02/02/19 1018       Patient was given sequential compression devices, early ambulation, and chemoprophylaxis to prevent DVT.  Patient benefited maximally from hospital stay and there were no complications.    Recent vital signs:  Patient Vitals for the past 24 hrs:   BP Temp Temp src Pulse Resp SpO2 Height Weight  02/03/19 0513 95/68 98.1 F (36.7 C) - 78 16 97 % - -  02/03/19 0128 92/62 97.8 F (36.6 C) Oral 65 16 94 % - -  02/02/19 2122 (!) 92/57 97.9 F (36.6 C) Oral 63 16 96 % - -  02/02/19 1706 90/63 98.1 F (36.7 C) Oral 73 16 94 % - -  02/02/19 1607 (!) 86/65 98.1 F (36.7 C) Oral 67 16 95 % - -  02/02/19 1508 100/72 - - 71 16 100 % - -  02/02/19 1402 103/69 97.8 F (36.6 C) - 67 16 100 % - -  02/02/19 1345 106/72 - - 68 13 100 % - -  02/02/19 1330 110/70 - - 63 16 100 % - -  02/02/19 1315 111/68 - - (!) 59 15 100 % - -  02/02/19 1300 103/66 - - (!) 59 16 100 % - -  02/02/19 1256 99/68 - - 62 11 100 % - -  02/02/19 1245 103/66 - - - 14 - - -  02/02/19 1230 110/70 - - (!) 59 12 99 % - -  02/02/19 1215 (!) 92/55 - - 63 14 100 % - -  02/02/19 1200 103/61 - - (!) 58 18 100 % - -  02/02/19 1150 97/70 (!) 97.4 F (36.3 C) - 61 15 100 % - -  02/02/19 1145 (!) 77/55 (!) 97 F (36.1 C) - 66 14 100 % - -  02/02/19 [id number]  107/71 98.1 F (36.7 C) Oral 64 16 99 % - -  02/02/19 0826 - - - - - - 5\' 1"  (1.549 m) 56.8 kg     Recent laboratory studies: No results for input(s): WBC, HGB, HCT, PLT, NA, K, CL, CO2, BUN, CREATININE, GLUCOSE, INR, CALCIUM in the last 72 hours.  Invalid input(s): PT, 2   Discharge Medications:   Allergies as of 02/03/2019      Reactions   Atenolol Swelling   Throat swells Throat swells   Oxycodone Itching   Alendronate Sodium Swelling   Other reaction(s): Other Felt like got stuck throat Other reaction(s): Other Felt like got stuck throat Throat swelling.    Aspirin Swelling, Other (See Comments)   Also metallic taste    Levofloxacin Other (See Comments)   Blurred vision Blurred vision   Moxifloxacin Other (See Comments)   Blurred vision Blurred vision   Amoxicillin-pot Clavulanate Photosensitivity, Other (See Comments)   Blurred vision Blurred vision Blurred vision      Medication List    TAKE  these medications   acetaminophen 325 MG tablet Commonly known as: TYLENOL Take 650 mg by mouth every 6 (six) hours as needed (pain).   amLODipine 5 MG tablet Commonly known as: NORVASC Take 5 mg by mouth daily.   apixaban 2.5 MG Tabs tablet Commonly known as: ELIQUIS Take 1 tablet (2.5 mg total) by mouth every 12 (twelve) hours.   atorvastatin 10 MG tablet Commonly known as: LIPITOR Take 10 mg by mouth at bedtime.   buPROPion 300 MG 24 hr tablet Commonly known as: WELLBUTRIN XL Take 300 mg by mouth daily.   busPIRone 7.5 MG tablet Commonly known as: BUSPAR Take 11.25 mg by mouth 2 (two) times daily.   butalbital-acetaminophen-caffeine 50-325-40 MG tablet Commonly known as: FIORICET Take 1 tablet by mouth every 6 (six) hours as needed for headache.   Calcium-Vitamin D 600-125 MG-UNIT Tabs Take 1 tablet by mouth 2 (two) times daily.   carvedilol 6.25 MG tablet Commonly known as: COREG Take 1.5 tablets (9.375 mg total) by mouth 2 (two) times daily.   fluticasone 0.05 % cream Commonly known as: CUTIVATE Apply 1 application topically daily as needed (irritation).   fluticasone 50 MCG/ACT nasal spray Commonly known as: FLONASE Place 2 sprays into both nostrils daily.   HYDROcodone-acetaminophen 5-325 MG tablet Commonly known as: NORCO/VICODIN Take 1-2 tablets by mouth every 6 (six) hours as needed for moderate pain (pain score 4-6).   lisinopril 20 MG tablet Commonly known as: ZESTRIL Take 20 mg by mouth daily.   Narcosoft Herbal Lax Caps Take 2 capsules by mouth every evening.   sertraline 100 MG tablet Commonly known as: ZOLOFT Take 100 mg by mouth daily.   tiZANidine 4 MG tablet Commonly known as: Zanaflex Take 0.5-1 tablets (2-4 mg total) by mouth every 6 (six) hours as needed.   valACYclovir 500 MG tablet Commonly known as: VALTREX Take 1 tablet by mouth as needed.            Durable Medical Equipment  (From admission, onward)         Start      Ordered   02/02/19 1414  DME Walker rolling  Once    Question:  Patient needs a walker to treat with the following condition  Answer:  Primary osteoarthritis of left hip   02/02/19 1414   02/02/19 1414  DME 3 n 1  Once     02/02/19 1414   02/02/19  1414  DME Bedside commode  Once    Question:  Patient needs a bedside commode to treat with the following condition  Answer:  Primary osteoarthritis of left hip   02/02/19 1414          Diagnostic Studies: Dg Chest 2 View  Result Date: 01/30/2019 CLINICAL DATA:  Preop for hip replacement. EXAM: CHEST - 2 VIEW COMPARISON:  10/05/2009 FINDINGS: Cardiac silhouette is normal in size and configuration. No mediastinal or hilar masses. No evidence of adenopathy. Clear lungs.  No pleural effusion or pneumothorax. There changes from previous cervical spine and lumbar spine fusion surgery. Skeletal structures otherwise unremarkable. IMPRESSION: No active cardiopulmonary disease. Electronically Signed   By: Amie Portland M.D.   On: 01/30/2019 09:20   Dg C-arm 1-60 Min-no Report  Result Date: 02/02/2019 Fluoroscopy was utilized by the requesting physician.  No radiographic interpretation.   Dg Hip Operative Unilat W Or W/o Pelvis Left  Result Date: 02/02/2019 CLINICAL DATA:  Primary osteoarthritis of the left hip. EXAM: OPERATIVE LEFT HIP WITH PELVIS; DG C-ARM 1-60 MIN-NO REPORT COMPARISON:  Radiograph dated 10/22/2017 FINDINGS: AP C-arm images demonstrate that the acetabular and femoral components of the left total hip prosthesis appear in excellent position in the AP projection. No fractures. IMPRESSION: Satisfactory appearance of the left total hip prosthesis. FLUOROSCOPY TIME:  36 seconds.  4.36 mGy. C-arm fluoroscopic images were obtained intraoperatively and submitted for post operative interpretation. Electronically Signed   By: Francene Boyers M.D.   On: 02/02/2019 13:57    Disposition: Discharge disposition: 01-Home or Self  Care       Discharge Instructions    Call MD / Call 911   Complete by: As directed    If you experience chest pain or shortness of breath, CALL 911 and be transported to the hospital emergency room.  If you develope a fever above 101 F, pus (white drainage) or increased drainage or redness at the wound, or calf pain, call your surgeon's office.   Constipation Prevention   Complete by: As directed    Drink plenty of fluids.  Prune juice may be helpful.  You may use a stool softener, such as Colace (over the counter) 100 mg twice a day.  Use MiraLax (over the counter) for constipation as needed.   Diet - low sodium heart healthy   Complete by: As directed    Discharge instructions   Complete by: As directed    INSTRUCTIONS AFTER JOINT REPLACEMENT   Remove items at home which could result in a fall. This includes throw rugs or furniture in walking pathways ICE to the affected joint every three hours while awake for 30 minutes at a time, for at least the first 3-5 days, and then as needed for pain and swelling.  Continue to use ice for pain and swelling. You may notice swelling that will progress down to the foot and ankle.  This is normal after surgery.  Elevate your leg when you are not up walking on it.   Continue to use the breathing machine you got in the hospital (incentive spirometer) which will help keep your temperature down.  It is common for your temperature to cycle up and down following surgery, especially at night when you are not up moving around and exerting yourself.  The breathing machine keeps your lungs expanded and your temperature down.   DIET:  As you were doing prior to hospitalization, we recommend a well-balanced diet.  DRESSING / WOUND CARE /  SHOWERING  You may shower 3 days after surgery, but keep the wounds dry during showering.  You may use an occlusive plastic wrap (Press'n Seal for example), NO SOAKING/SUBMERGING IN THE BATHTUB.  If the bandage gets wet,  change with a clean dry gauze.  If the incision gets wet, pat the wound dry with a clean towel.  ACTIVITY  Increase activity slowly as tolerated, but follow the weight bearing instructions below.   No driving for 6 weeks or until further direction given by your physician.  You cannot drive while taking narcotics.  No lifting or carrying greater than 10 lbs. until further directed by your surgeon. Avoid periods of inactivity such as sitting longer than an hour when not asleep. This helps prevent blood clots.  You may return to work once you are authorized by your doctor.     WEIGHT BEARING   Weight bearing as tolerated with assist device (walker, cane, etc) as directed, use it as long as suggested by your surgeon or therapist, typically at least 4-6 weeks.   EXERCISES  Results after joint replacement surgery are often greatly improved when you follow the exercise, range of motion and muscle strengthening exercises prescribed by your doctor. Safety measures are also important to protect the joint from further injury. Any time any of these exercises cause you to have increased pain or swelling, decrease what you are doing until you are comfortable again and then slowly increase them. If you have problems or questions, call your caregiver or physical therapist for advice.   Rehabilitation is important following a joint replacement. After just a few days of immobilization, the muscles of the leg can become weakened and shrink (atrophy).  These exercises are designed to build up the tone and strength of the thigh and leg muscles and to improve motion. Often times heat used for twenty to thirty minutes before working out will loosen up your tissues and help with improving the range of motion but do not use heat for the first two weeks following surgery (sometimes heat can increase post-operative swelling).   These exercises can be done on a training (exercise) mat, on the floor, on a table or on a  bed. Use whatever works the best and is most comfortable for you.    Use music or television while you are exercising so that the exercises are a pleasant break in your day. This will make your life better with the exercises acting as a break in your routine that you can look forward to.   Perform all exercises about fifteen times, three times per day or as directed.  You should exercise both the operative leg and the other leg as well.   Exercises include:   Quad Sets - Tighten up the muscle on the front of the thigh (Quad) and hold for 5-10 seconds.   Straight Leg Raises - With your knee straight (if you were given a brace, keep it on), lift the leg to 60 degrees, hold for 3 seconds, and slowly lower the leg.  Perform this exercise against resistance later as your leg gets stronger.  Leg Slides: Lying on your back, slowly slide your foot toward your buttocks, bending your knee up off the floor (only go as far as is comfortable). Then slowly slide your foot back down until your leg is flat on the floor again.  Angel Wings: Lying on your back spread your legs to the side as far apart as you can without causing discomfort.  Hamstring Strength:  Lying on your back, push your heel against the floor with your leg straight by tightening up the muscles of your buttocks.  Repeat, but this time bend your knee to a comfortable angle, and push your heel against the floor.  You may put a pillow under the heel to make it more comfortable if necessary.   A rehabilitation program following joint replacement surgery can speed recovery and prevent re-injury in the future due to weakened muscles. Contact your doctor or a physical therapist for more information on knee rehabilitation.    CONSTIPATION  Constipation is defined medically as fewer than three stools per week and severe constipation as less than one stool per week.  Even if you have a regular bowel pattern at home, your normal regimen is likely to be  disrupted due to multiple reasons following surgery.  Combination of anesthesia, postoperative narcotics, change in appetite and fluid intake all can affect your bowels.   YOU MUST use at least one of the following options; they are listed in order of increasing strength to get the job done.  They are all available over the counter, and you may need to use some, POSSIBLY even all of these options:    Drink plenty of fluids (prune juice may be helpful) and high fiber foods Colace 100 mg by mouth twice a day  Senokot for constipation as directed and as needed Dulcolax (bisacodyl), take with full glass of water  Miralax (polyethylene glycol) once or twice a day as needed.  If you have tried all these things and are unable to have a bowel movement in the first 3-4 days after surgery call either your surgeon or your primary doctor.    If you experience loose stools or diarrhea, hold the medications until you stool forms back up.  If your symptoms do not get better within 1 week or if they get worse, check with your doctor.  If you experience "the worst abdominal pain ever" or develop nausea or vomiting, please contact the office immediately for further recommendations for treatment.   ITCHING:  If you experience itching with your medications, try taking only a single pain pill, or even half a pain pill at a time.  You can also use Benadryl over the counter for itching or also to help with sleep.   TED HOSE STOCKINGS:  Use stockings on both legs until for at least 2 weeks or as directed by physician office. They may be removed at night for sleeping.  MEDICATIONS:  See your medication summary on the "After Visit Summary" that nursing will review with you.  You may have some home medications which will be placed on hold until you complete the course of blood thinner medication.  It is important for you to complete the blood thinner medication as prescribed.  PRECAUTIONS:  If you experience chest pain or  shortness of breath - call 911 immediately for transfer to the hospital emergency department.   If you develop a fever greater that 101 F, purulent drainage from wound, increased redness or drainage from wound, foul odor from the wound/dressing, or calf pain - CONTACT YOUR SURGEON.                                                   FOLLOW-UP APPOINTMENTS:  If you do not already  have a post-op appointment, please call the office for an appointment to be seen by your surgeon.  Guidelines for how soon to be seen are listed in your "After Visit Summary", but are typically between 1-4 weeks after surgery.  OTHER INSTRUCTIONS:   Knee Replacement:  Do not place pillow under knee, focus on keeping the knee straight while resting. CPM instructions: 0-90 degrees, 2 hours in the morning, 2 hours in the afternoon, and 2 hours in the evening. Place foam block, curve side up under heel at all times except when in CPM or when walking.  DO NOT modify, tear, cut, or change the foam block in any way.  MAKE SURE YOU:  Understand these instructions.  Get help right away if you are not doing well or get worse.    Thank you for letting us be a part of your medical care team.  It is a privilege we respect greatly.  We hope these instructions will help you stay on track for a fast and full recovery!   Increase activity slowly as tolerated   Complete by: As directed       Follow-up Information    Marcene Corning, MD. Go on 02/15/2019.   Specialty: Orthopedic Surgery Why: You appointment has been scheduled for 1015 Contact information: [address] [place] [address] [phone]        [facility] Follow up.   Specialty: Home Health Services Why: You will be seen at home by HHPT for 5 visits prior to starting outpatient physical therapy  Contact information: [address] [place] [address] [phone]        [facility]. Go on 02/15/2019.   Why: You are scheduled to start  outpatient physical therapy at 300. Please arrive a few minutes early to complete your paperwork.  Contact information: [phone]           Signed: Ginger Organ Gennesis Hogland 02/03/2019, 7:47 AM

## 2019-02-03 NOTE — Plan of Care (Signed)
Patient discharged home in stable condition 

## 2019-02-03 NOTE — Progress Notes (Signed)
RW and 3 in 1 delivered to patient room by [employer]

## 2019-03-15 ENCOUNTER — Other Ambulatory Visit: Payer: Medicare Other

## 2019-05-23 ENCOUNTER — Emergency Department (HOSPITAL_COMMUNITY): Payer: Medicare Other

## 2019-05-23 ENCOUNTER — Other Ambulatory Visit: Payer: Self-pay

## 2019-05-23 ENCOUNTER — Encounter (HOSPITAL_COMMUNITY): Payer: Self-pay | Admitting: *Deleted

## 2019-05-23 ENCOUNTER — Emergency Department (HOSPITAL_COMMUNITY)
Admission: EM | Admit: 2019-05-23 | Discharge: 2019-05-23 | Disposition: A | Discharge status: Home / Self Care | Payer: Medicare Other | Attending: Emergency Medicine | Admitting: Emergency Medicine

## 2019-05-23 DIAGNOSIS — Y9301 Activity, walking, marching and hiking: Secondary | ICD-10-CM | POA: Diagnosis not present

## 2019-05-23 DIAGNOSIS — I10 Essential (primary) hypertension: Secondary | ICD-10-CM | POA: Diagnosis not present

## 2019-05-23 DIAGNOSIS — Z79899 Other long term (current) drug therapy: Secondary | ICD-10-CM | POA: Diagnosis not present

## 2019-05-23 DIAGNOSIS — Y929 Unspecified place or not applicable: Secondary | ICD-10-CM | POA: Diagnosis not present

## 2019-05-23 DIAGNOSIS — Z96642 Presence of left artificial hip joint: Secondary | ICD-10-CM | POA: Diagnosis not present

## 2019-05-23 DIAGNOSIS — Y999 Unspecified external cause status: Secondary | ICD-10-CM | POA: Diagnosis not present

## 2019-05-23 DIAGNOSIS — Z7901 Long term (current) use of anticoagulants: Secondary | ICD-10-CM | POA: Diagnosis not present

## 2019-05-23 DIAGNOSIS — S32020A Wedge compression fracture of second lumbar vertebra, initial encounter for closed fracture: Secondary | ICD-10-CM | POA: Insufficient documentation

## 2019-05-23 DIAGNOSIS — S3992XA Unspecified injury of lower back, initial encounter: Secondary | ICD-10-CM | POA: Diagnosis present

## 2019-05-23 DIAGNOSIS — W010XXA Fall on same level from slipping, tripping and stumbling without subsequent striking against object, initial encounter: Secondary | ICD-10-CM | POA: Diagnosis not present

## 2019-05-23 HISTORY — DX: Other intervertebral disc degeneration, lumbar region without mention of lumbar back pain or lower extremity pain: M51.369

## 2019-05-23 HISTORY — DX: Other intervertebral disc degeneration, lumbar region: M51.36

## 2019-05-23 HISTORY — DX: Other cervical disc degeneration, unspecified cervical region: M50.30

## 2019-05-23 LAB — COMPREHENSIVE METABOLIC PANEL
ALT: 23 U/L (ref 0–44)
AST: 32 U/L (ref 15–41)
Albumin: 4.1 g/dL (ref 3.5–5.0)
Alkaline Phosphatase: 69 U/L (ref 38–126)
Anion gap: 11 (ref 5–15)
BUN: 14 mg/dL (ref 8–23)
CO2: 27 mmol/L (ref 22–32)
Calcium: 9.4 mg/dL (ref 8.9–10.3)
Chloride: 100 mmol/L (ref 98–111)
Creatinine, Ser: 0.82 mg/dL (ref 0.44–1.00)
GFR calc Af Amer: >60 mL/min (ref >60)
GFR calc non Af Amer: >60 mL/min (ref >60)
Glucose, Bld: 104 mg/dL — ABNORMAL HIGH (ref 70–99)
Potassium: 4.1 mmol/L (ref 3.5–5.1)
Sodium: 138 mmol/L (ref 135–145)
Total Bilirubin: 0.8 mg/dL (ref 0.3–1.2)
Total Protein: 6.9 g/dL (ref 6.5–8.1)

## 2019-05-23 LAB — URINALYSIS, ROUTINE W REFLEX MICROSCOPIC
Bilirubin Urine: NEGATIVE
Glucose, UA: NEGATIVE mg/dL
Hgb urine dipstick: NEGATIVE
Ketones, ur: 5 mg/dL — AB
Leukocytes,Ua: NEGATIVE
Nitrite: NEGATIVE
Protein, ur: NEGATIVE mg/dL
Specific Gravity, Urine: 1.024 (ref 1.005–1.030)
pH: 5 (ref 5.0–8.0)

## 2019-05-23 LAB — CBC WITH DIFFERENTIAL/PLATELET
Abs Immature Granulocytes: 0.01 10*3/uL (ref 0.00–0.07)
Basophils Absolute: 0 10*3/uL (ref 0.0–0.1)
Basophils Relative: 0 %
Eosinophils Absolute: 0.1 10*3/uL (ref 0.0–0.5)
Eosinophils Relative: 1 %
HCT: 41.3 % (ref 36.0–46.0)
Hemoglobin: 13 g/dL (ref 12.0–15.0)
Immature Granulocytes: 0 %
Lymphocytes Relative: 16 %
Lymphs Abs: 1.2 10*3/uL (ref 0.7–4.0)
MCH: 29.1 pg (ref 26.0–34.0)
MCHC: 31.5 g/dL (ref 30.0–36.0)
MCV: 92.4 fL (ref 80.0–100.0)
Monocytes Absolute: 0.7 10*3/uL (ref 0.1–1.0)
Monocytes Relative: 10 %
Neutro Abs: 5.4 10*3/uL (ref 1.7–7.7)
Neutrophils Relative %: 73 %
Platelets: 241 10*3/uL (ref 150–400)
RBC: 4.47 MIL/uL (ref 3.87–5.11)
RDW: 14.8 % (ref 11.5–15.5)
WBC: 7.4 10*3/uL (ref 4.0–10.5)
nRBC: 0 % (ref 0.0–0.2)

## 2019-05-23 MED ORDER — MORPHINE SULFATE 15 MG PO TABS
15.0000 mg | ORAL_TABLET | Freq: Once | ORAL | Status: AC
  Administered 2019-05-23: 16:00:00 15 mg via ORAL
  Filled 2019-05-23: qty 1

## 2019-05-23 MED ORDER — FENTANYL CITRATE (PF) 100 MCG/2ML IJ SOLN
50.0000 ug | Freq: Once | INTRAMUSCULAR | Status: AC
  Administered 2019-05-23: 15:00:00 50 ug via INTRAVENOUS
  Filled 2019-05-23: qty 2

## 2019-05-23 MED ORDER — FENTANYL CITRATE (PF) 100 MCG/2ML IJ SOLN
50.0000 ug | Freq: Once | INTRAMUSCULAR | Status: AC
  Administered 2019-05-23: 50 ug via INTRAVENOUS
  Filled 2019-05-23: qty 2

## 2019-05-23 MED ORDER — IOHEXOL 300 MG/ML  SOLN
100.0000 mL | Freq: Once | INTRAMUSCULAR | Status: AC | PRN
  Administered 2019-05-23: 15:00:00 75 mL via INTRAVENOUS

## 2019-05-23 MED ORDER — DICLOFENAC SODIUM 1 % EX GEL: 2.0000 g | Freq: Four times a day (QID) | CUTANEOUS | 0 refills | Status: AC

## 2019-05-23 MED ORDER — MORPHINE SULFATE 15 MG PO TABS: 15.0000 mg | ORAL_TABLET | Freq: Four times a day (QID) | ORAL | 0 refills | Status: DC | PRN

## 2019-05-23 NOTE — ED Triage Notes (Signed)
Pt brought in by [facility] with c/o fall last night landing on her tailbone. Pt c/o pain to lower back. Pt has hx of spinal fusions already and is waiting to have more done at this time.

## 2019-05-23 NOTE — ED Notes (Signed)
Patient noted to ambulate from bed to bedside commode without assistance. Some slight pain noted with "grunt" sound during ambulation but patient tolerated it well.

## 2019-05-23 NOTE — Progress Notes (Signed)
Orthopedic Tech Progress Note Patient Details:  Stephanie Kim Feb 26, 1951 [id number] RN called requesting [employer] but was not able to get in touch with them. So she asked could I reach out and get patient this brace. I called [employer] to put in the order and I called back just to let them know how long it was going to be and the RN was rude and made very ugly remark.  Patient ID: Stephanie Kim, female   DOB: [birth year], 69 y.o.   MRN: [id number]   [name] 05/23/2019, 4:11 PM

## 2019-05-23 NOTE — ED Notes (Signed)
Spoke with ortho tech at [facility] putting in the order for the lumbar corsett.

## 2019-05-23 NOTE — ED Provider Notes (Signed)
[facility] EMERGENCY DEPARTMENT Provider Note   CSN: [id number] Arrival date & time: 05/23/19  1217     History Chief Complaint  Patient presents with  . Fall    Stephanie Kim is a 69 y.o. female.  HPI 69 year old female presents with severe back pain after a fall.  Fell yesterday evening while walking her dog.  She fell backwards and landed on the grass on her buttocks.  Having severe pain to her low back.  Has chronic back pain but this is worse.  She is feeling some tingling going down her left leg.  No weakness in her extremities.  No bowel/bladder incontinence.  Has not take anything for the pain.  She also has noticed some concomitant lower abdominal pain during this time.  Did not hit her head.  No saddle anesthesia.   Past Medical History:  Diagnosis Date  . Anxiety   . Arthritis   . Degenerative disc disease, cervical   . Degenerative disc disease, lumbar   . Depression   . Dysrhythmia    palpitations according to her PA  Just comes and goes-placed on Metoprolo  . Fibromyalgia   . Headache    migraine  - last one was this past week  . High cholesterol   . High cholesterol   . Hypertension   . TMJ (dislocation of temporomandibular joint)    POPS MAINLY    Patient Active Problem List   Diagnosis Date Noted  . Primary localized osteoarthritis of left hip 02/02/2019  . Primary osteoarthritis of left hip 02/02/2019  . Palpitations 06/04/2018  . Hyperlipidemia 06/04/2018  . Spondylolisthesis of lumbar region 10/25/2014    Past Surgical History:  Procedure Laterality Date  . BACK SURGERY     x 4 times  . BUNIONECTOMY     left foot.    bilateral, but she's not sure about right foot  . EYE SURGERY     lasik  . KNEE ARTHROSCOPY     left  . ROTATOR CUFF REPAIR     cleaned out and removed spur  . TONSILLECTOMY    . TOTAL HIP ARTHROPLASTY Left 02/02/2019   Procedure: LEFT  HIP ARTHROPLASTY ANTERIOR APPROACH;  Surgeon: Marcene Corning, MD;  Location: [facility]  ORS;  Service: Orthopedics;  Laterality: Left;  . TUBAL LIGATION       OB History   No obstetric history on file.     Family History  Problem Relation Age of Onset  . Hyperlipidemia Mother   . Hypertension Mother   . COPD Mother   . Arthritis Mother   . Heart attack Father   . Hyperlipidemia Father   . Heart attack Brother   . Hyperlipidemia Brother   . Hypertension Brother     Social History   Tobacco Use  . Smoking status: Never Smoker  . Smokeless tobacco: Never Used  Substance Use Topics  . Alcohol use: Yes    Alcohol/week: 10.0 standard drinks    Types: 7 Glasses of wine, 3 Shots of liquor per week    Comment: weekly  . Drug use: No    Home Medications Prior to Admission medications   Medication Sig Start Date End Date Taking? Authorizing Provider  acetaminophen (TYLENOL) 325 MG tablet Take 650 mg by mouth every 6 (six) hours as needed (pain).    [provider]  amLODipine (NORVASC) 5 MG tablet Take 5 mg by mouth daily.  03/30/18   [provider]  apixaban Everlene Balls) 2.5  MG TABS tablet Take 1 tablet (2.5 mg total) by mouth every 12 (twelve) hours. 02/03/19   Loni Dolly, PA-C  atorvastatin (LIPITOR) 10 MG tablet Take 10 mg by mouth at bedtime.     [provider]  buPROPion (WELLBUTRIN XL) 300 MG 24 hr tablet Take 300 mg by mouth daily.  10/14/13   [provider]  busPIRone (BUSPAR) 7.5 MG tablet Take 11.25 mg by mouth 2 (two) times daily.  01/29/18   [provider]  butalbital-acetaminophen-caffeine (FIORICET, ESGIC) 50-325-40 MG tablet Take 1 tablet by mouth every 6 (six) hours as needed for headache.  08/25/15   [provider]  Calcium Carbonate-Vitamin D (CALCIUM-VITAMIN D) 600-125 MG-UNIT TABS Take 1 tablet by mouth 2 (two) times daily.     [provider]  carvedilol (COREG) 6.25 MG tablet Take 1.5 tablets (9.375 mg total) by mouth 2 (two) times daily. 01/04/19   Adrian Prows, MD  diclofenac Sodium  (VOLTAREN) 1 % GEL Apply 2 g topically 4 (four) times daily. 05/23/19   Sherwood Gambler, MD  fluticasone (CUTIVATE) 0.05 % cream Apply 1 application topically daily as needed (irritation).     [provider]  fluticasone (FLONASE) 50 MCG/ACT nasal spray Place 2 sprays into both nostrils daily.    [provider]  HYDROcodone-acetaminophen (NORCO/VICODIN) 5-325 MG tablet Take 1-2 tablets by mouth every 6 (six) hours as needed for moderate pain (pain score 4-6). 02/03/19   Loni Dolly, PA-C  lisinopril (PRINIVIL,ZESTRIL) 20 MG tablet Take 20 mg by mouth daily. 10/14/13   [provider]  Misc Natural Products (NARCOSOFT HERBAL LAX) CAPS Take 2 capsules by mouth every evening.    [provider]  morphine (MSIR) 15 MG tablet Take 1 tablet (15 mg total) by mouth every 6 (six) hours as needed for severe pain. 05/23/19   Sherwood Gambler, MD  sertraline (ZOLOFT) 100 MG tablet Take 100 mg by mouth daily. 10/14/13   [provider]  tiZANidine (ZANAFLEX) 4 MG tablet Take 0.5-1 tablets (2-4 mg total) by mouth every 6 (six) hours as needed. 02/03/19 02/03/20  Loni Dolly, PA-C  valACYclovir (VALTREX) 500 MG tablet Take 1 tablet by mouth as needed. 11/09/16   [provider]    Allergies    Atenolol, Oxycodone, Alendronate sodium, Aspirin, Levofloxacin, Moxifloxacin, Meloxicam, and Amoxicillin-pot clavulanate  Review of Systems   Review of Systems  Gastrointestinal: Positive for abdominal pain.  Genitourinary:       No incontinence  Musculoskeletal: Positive for back pain.  Neurological: Positive for numbness. Negative for weakness.  All other systems reviewed and are negative.   Physical Exam Updated Vital Signs BP 101/70 (BP Location: Left Arm)   Pulse 73   Temp 98.9 F (37.2 C) (Oral)   Resp 17   Ht 5' 1.5" (1.562 m)   Wt 54.4 kg   LMP  (LMP Unknown)   SpO2 97%   BMI 22.31 kg/m   Physical Exam Vitals and nursing note reviewed.    Constitutional:      General: She is in acute distress (in pain).     Appearance: She is well-developed.  HENT:     Head: Normocephalic and atraumatic.     Right Ear: External ear normal.     Left Ear: External ear normal.     Nose: Nose normal.  Eyes:     General:        Right eye: No discharge.        Left  eye: No discharge.  Cardiovascular:     Rate and Rhythm: Normal rate and regular rhythm.     Heart sounds: Normal heart sounds.  Pulmonary:     Effort: Pulmonary effort is normal.     Breath sounds: Normal breath sounds.  Abdominal:     Palpations: Abdomen is soft.     Tenderness: There is abdominal tenderness (mild, lower abdomen).  Musculoskeletal:     Thoracic back: No tenderness.     Lumbar back: Tenderness present.  Skin:    General: Skin is warm and dry.  Neurological:     Mental Status: She is alert.     Comments: Patient reports some decreased subjective sensation to the lateral left leg.  She has equal strength in both lower extremities though somewhat limited due to pain that this elicits in her back.  Psychiatric:        Mood and Affect: Mood is not anxious.     ED Results / Procedures / Treatments   Labs (all labs ordered are listed, but only abnormal results are displayed) Labs Reviewed  COMPREHENSIVE METABOLIC PANEL - Abnormal; Notable for the following components:      Result Value   Glucose, Bld 104 (*)    All other components within normal limits  URINALYSIS, ROUTINE W REFLEX MICROSCOPIC - Abnormal; Notable for the following components:   Ketones, ur 5 (*)    All other components within normal limits  CBC WITH DIFFERENTIAL/PLATELET    EKG None  Radiology CT ABDOMEN PELVIS W CONTRAST  Result Date: 05/23/2019 CLINICAL DATA:  Pain status post fall. Low back pain. EXAM: CT ABDOMEN AND PELVIS WITH CONTRAST TECHNIQUE: Multidetector CT imaging of the abdomen and pelvis was performed using the standard protocol following bolus administration of  intravenous contrast. CONTRAST:  55mL OMNIPAQUE IOHEXOL 300 MG/ML  SOLN COMPARISON:  None. FINDINGS: Lower chest: The lung bases are clear. The heart size is normal. Hepatobiliary: The liver is normal. Normal gallbladder.There is no biliary ductal dilation. Pancreas: Normal contours without ductal dilatation. No peripancreatic fluid collection. Spleen: No splenic laceration or hematoma. Adrenals/Urinary Tract: --Adrenal glands: No adrenal hemorrhage. --Right kidney/ureter: No hydronephrosis or perinephric hematoma. --Left kidney/ureter: No hydronephrosis or perinephric hematoma. --Urinary bladder: Unremarkable. Stomach/Bowel: --Stomach/Duodenum: No hiatal hernia or other gastric abnormality. Normal duodenal course and caliber. --Small bowel: No dilatation or inflammation. --Colon: No focal abnormality. --Appendix: Not visualized. No right lower quadrant inflammation or free fluid. Vascular/Lymphatic: Atherosclerotic calcification is present within the non-aneurysmal abdominal aorta, without hemodynamically significant stenosis. --No retroperitoneal lymphadenopathy. --No mesenteric lymphadenopathy. --No pelvic or inguinal lymphadenopathy. Reproductive: Unremarkable Other: No ascites or free air. The abdominal wall is normal. Musculoskeletal. There is an acute appearing compression fracture of the L2 vertebral body with approximately 30% height loss anteriorly. There is no significant retropulsion. The patient is status post prior L3 through L5 posterior fusion. The hardware is intact. The patient is status post total hip arthroplasty on the left. IMPRESSION: 1. Acute appearing compression fracture of the L2 vertebral body with approximately 30% height loss anteriorly. No significant retropulsion. 2. No other acute intra-abdominal or pelvic traumatic injury. Electronically Signed   By: Katherine Mantle M.D.   On: 05/23/2019 15:11    Procedures Procedures (including critical care time)  Medications Ordered in  ED Medications  morphine (MSIR) tablet 15 mg (has no administration in time range)  fentaNYL (SUBLIMAZE) injection 50 mcg (50 mcg Intravenous Given 05/23/19 1418)  iohexol (OMNIPAQUE) 300 MG/ML solution 100 mL (75 mLs  Intravenous Contrast Given 05/23/19 1453)  fentaNYL (SUBLIMAZE) injection 50 mcg (50 mcg Intravenous Given 05/23/19 1526)    ED Course  I have reviewed the triage vital signs and the nursing notes.  Pertinent labs & imaging results that were available during my care of the patient were reviewed by me and considered in my medical decision making (see chart for details).    MDM Rules/Calculators/A&P                      CT shows L2 compression fracture.  No other intra-abdominal injury.  She reports some mild nonspecific tingling to her left leg but otherwise neuro exam is intact and she is able to walk.  My suspicion of acute cord injury/emergency is very low.  No retropulsion seen on CT.  Discussed with neurosurgery, Val Eagle, who recommends lumbar corset if patient would like, but otherwise follow-up with Dr. Venetia Maxon as an outpatient.  Patient has a lot of side effects to narcotics but no anaphylaxis.  She would like to try Voltaren gel which I think is reasonable.  We will also give short course of oral morphine, she has tolerated morphine in the past. Final Clinical Impression(s) / ED Diagnoses Final diagnoses:  Closed compression fracture of L2 vertebra, initial encounter (HCC)    Rx / DC Orders ED Discharge Orders         Ordered    diclofenac Sodium (VOLTAREN) 1 % GEL  4 times daily     05/23/19 1544    morphine (MSIR) 15 MG tablet  Every 6 hours PRN     05/23/19 1544           Pricilla Loveless, MD 05/23/19 1551

## 2019-05-23 NOTE — Discharge Instructions (Signed)
If you develop worsening, recurrent, or continued back pain, numbness or weakness in the legs, incontinence of your bowels or bladders, numbness of your buttocks, fever, abdominal pain, or any other new/concerning symptoms then return to the ER for evaluation.  

## 2019-06-01 ENCOUNTER — Other Ambulatory Visit: Payer: Medicare Other

## 2019-06-25 ENCOUNTER — Ambulatory Visit: Payer: Medicare Other | Attending: Internal Medicine

## 2019-06-25 DIAGNOSIS — Z23 Encounter for immunization: Secondary | ICD-10-CM | POA: Insufficient documentation

## 2019-06-25 NOTE — Progress Notes (Signed)
   Covid-19 Vaccination Clinic  Name:  [name]    MRN: [id number] DOB: [birth year]  06/25/2019  [name] was observed post Covid-19 immunization for 15 minutes without incident. She was provided with Vaccine Information Sheet and instruction to access the V-Safe system.   [name] was instructed to call 911 with any severe reactions post vaccine: Marland Kitchen Difficulty breathing  . Swelling of face and throat  . A fast heartbeat  . A bad rash all over body  . Dizziness and weakness   Immunizations Administered    Name Date Dose VIS Date Route   Pfizer COVID-19 Vaccine 06/25/2019 12:50 PM 0.3 mL 04/02/2019 Intramuscular   Manufacturer: [employer], [employer]   Lot: [id number]   NDC: [address]-2

## 2019-07-02 ENCOUNTER — Other Ambulatory Visit: Payer: Self-pay | Admitting: Cardiology

## 2019-07-21 ENCOUNTER — Ambulatory Visit: Payer: Medicare Other | Attending: Internal Medicine

## 2019-07-21 DIAGNOSIS — Z23 Encounter for immunization: Secondary | ICD-10-CM

## 2019-07-21 NOTE — Progress Notes (Signed)
   Covid-19 Vaccination Clinic  Name:  GREIDY SHERARD    MRN: [id number] DOB: 10-30-1950  07/21/2019  Ms. Kegel was observed post Covid-19 immunization for 15 minutes without incident. She was provided with Vaccine Information Sheet and instruction to access the V-Safe system.   Ms. Kugelman was instructed to call 911 with any severe reactions post vaccine: Marland Kitchen Difficulty breathing  . Swelling of face and throat  . A fast heartbeat  . A bad rash all over body  . Dizziness and weakness   Immunizations Administered    Name Date Dose VIS Date Route   Pfizer COVID-19 Vaccine 07/21/2019 12:46 PM 0.3 mL 04/02/2019 Intramuscular   Manufacturer: [employer], [employer]   Lot: [id number]   NDC: [address]-6

## 2019-09-03 ENCOUNTER — Other Ambulatory Visit: Payer: Self-pay

## 2019-09-03 ENCOUNTER — Ambulatory Visit
Admission: RE | Admit: 2019-09-03 | Discharge: 2019-09-03 | Disposition: A | Discharge status: Home / Self Care | Payer: Medicare Other | Source: Ambulatory Visit | Attending: Physician Assistant | Admitting: Physician Assistant

## 2019-09-03 DIAGNOSIS — E2839 Other primary ovarian failure: Secondary | ICD-10-CM

## 2020-01-05 ENCOUNTER — Other Ambulatory Visit: Payer: Self-pay | Admitting: Cardiology

## 2020-04-24 ENCOUNTER — Other Ambulatory Visit: Payer: Self-pay | Admitting: Cardiology

## 2022-06-19 ENCOUNTER — Encounter (HOSPITAL_BASED_OUTPATIENT_CLINIC_OR_DEPARTMENT_OTHER): Payer: Self-pay | Admitting: Pulmonary Disease

## 2022-06-19 ENCOUNTER — Ambulatory Visit (INDEPENDENT_AMBULATORY_CARE_PROVIDER_SITE_OTHER): Payer: Medicare Other | Admitting: Pulmonary Disease

## 2022-06-19 ENCOUNTER — Ambulatory Visit (HOSPITAL_BASED_OUTPATIENT_CLINIC_OR_DEPARTMENT_OTHER): Payer: Medicare Other | Admitting: Pulmonary Disease

## 2022-06-19 VITALS — BP 124/86 | HR 61 | Ht 60.0 in | Wt 128.8 lb

## 2022-06-19 DIAGNOSIS — R053 Chronic cough: Secondary | ICD-10-CM | POA: Diagnosis not present

## 2022-06-19 LAB — PULMONARY FUNCTION TEST
FEF 25-75 Post: 3.17 L/sec
FEF 25-75 Pre: 2.9 L/sec
FEF2575-%Change-Post: 9 %
FEF2575-%Pred-Post: 200 %
FEF2575-%Pred-Pre: 183 %
FEV1-%Change-Post: 3 %
FEV1-%Pred-Post: 135 %
FEV1-%Pred-Pre: 130 %
FEV1-Post: 2.48 L
FEV1-Pre: 2.4 L
FEV1FVC-%Change-Post: -1 %
FEV1FVC-%Pred-Pre: 112 %
FEV6-%Change-Post: 4 %
FEV6-%Pred-Post: 127 %
FEV6-%Pred-Pre: 121 %
FEV6-Post: 2.96 L
FEV6-Pre: 2.83 L
FEV6FVC-%Pred-Post: 105 %
FEV6FVC-%Pred-Pre: 105 %
FVC-%Change-Post: 4 %
FVC-%Pred-Post: 120 %
FVC-%Pred-Pre: 115 %
FVC-Post: 2.96 L
FVC-Pre: 2.83 L
Post FEV1/FVC ratio: 84 %
Post FEV6/FVC ratio: 100 %
Pre FEV1/FVC ratio: 85 %
Pre FEV6/FVC Ratio: 100 %

## 2022-06-19 MED ORDER — IPRATROPIUM BROMIDE 0.06 % NA SOLN: 2.0000 | Freq: Every day | NASAL | 5 refills | Status: AC

## 2022-06-19 NOTE — Patient Instructions (Addendum)
Chronic cough --ARRANGE for pulmonary function tests --START flonase TWO sprays per nare in the evening.  --START atrovent TWO in the morning  --CONTINUE reflux medication --I will contact your PCP to discontinue your lisinopril   Subpleural pulmonary nodule in RML Low-risk patient Follow-up is not indicated  Follow-up with me in 2 months

## 2022-06-19 NOTE — Progress Notes (Signed)
Subjective:   PATIENT ID: Stephanie Kim GENDER: female DOB: [birth year], MRN: [id number]  Chief Complaint  Patient presents with   Consult    Cough going on for years    Reason for Visit: New consult for chronic cough  Ms. Stephanie Kim is 72 year old never smoker with HTN, depression, osteoporosis who presents for evaluation for chronic cough.  PA Frederico Hamman, PCP note from 05/21/22 reviewed. She has a long standing cough for 10 years. Self-reports nasal gtt. Previously seen by ENT 2015 and 2017. S/p deviated septum.Unable to tolerate anti-histamines. Takes a PPI (protonix).  She reports nonproductive. Occurs throughout the day. Will sometimes have episodes where she will gag. Her cough is triggered by dry air. Has humidifier. Otherwise no known triggers. She reports constant nasal drip that she uses saline, flonase as needed. Trial off lisinopril with no improvement but unclear how long she was off of it and stated that was tried many years ago. She has severe heartburn when she is off her PPI. Denies shortness of breath or wheezing.   Denies personal asthma or recurrent respiratory infections. Mother has COPD. [place] with asthma.  Social History: Never smoker  Second hand smoker exposure  Environmental exposures: Denies  I have personally reviewed patient's past medical/family/social history, allergies, current medications.  Past Medical History:  Diagnosis Date   Anxiety    Arthritis    Degenerative disc disease, cervical    Degenerative disc disease, lumbar    Depression    Dysrhythmia    palpitations according to her PA  Just comes and goes-placed on Metoprolo   Fibromyalgia    Headache    migraine  - last one was this past week   High cholesterol    High cholesterol    Hypertension    TMJ (dislocation of temporomandibular joint)    POPS MAINLY     Family History  Problem Relation Age of Onset   Hyperlipidemia Mother    Hypertension Mother    COPD Mother     Arthritis Mother    Heart attack Father    Hyperlipidemia Father    Heart attack Brother    Hyperlipidemia Brother    Hypertension Brother      Social History   Occupational History   Not on file  Tobacco Use   Smoking status: Never   Smokeless tobacco: Never  Vaping Use   Vaping Use: Never used  Substance and Sexual Activity   Alcohol use: Yes    Alcohol/week: 10.0 standard drinks of alcohol    Types: 7 Glasses of wine, 3 Shots of liquor per week    Comment: weekly   Drug use: No   Sexual activity: Yes    Birth control/protection: Post-menopausal    Allergies  Allergen Reactions   Atenolol Swelling    Throat swells Throat swells    Oxycodone Itching   Alendronate Sodium Swelling    Other reaction(s): Other Felt like got stuck throat Other reaction(s): Other Felt like got stuck throat  Throat swelling.     Aspirin Swelling and Other (See Comments)    Also metallic taste     Levofloxacin Other (See Comments)    Blurred vision Blurred vision    Moxifloxacin Other (See Comments)    Blurred vision Blurred vision    Meloxicam Other (See Comments)    Hallucinations   Amoxicillin-Pot Clavulanate Photosensitivity and Other (See Comments)    Blurred vision Blurred vision Blurred vision  Outpatient Medications Prior to Visit  Medication Sig Dispense Refill   acetaminophen (TYLENOL) 325 MG tablet Take 650 mg by mouth every 6 (six) hours as needed (pain).     amLODipine (NORVASC) 5 MG tablet Take 5 mg by mouth daily.      apixaban (ELIQUIS) 2.5 MG TABS tablet Take 1 tablet (2.5 mg total) by mouth every 12 (twelve) hours. 60 tablet 0   atorvastatin (LIPITOR) 10 MG tablet Take 10 mg by mouth at bedtime.      buPROPion (WELLBUTRIN XL) 300 MG 24 hr tablet Take 300 mg by mouth daily.      busPIRone (BUSPAR) 7.5 MG tablet Take 11.25 mg by mouth 2 (two) times daily.      butalbital-acetaminophen-caffeine (FIORICET, ESGIC) 50-325-40 MG tablet Take 1 tablet by  mouth every 6 (six) hours as needed for headache.      Calcium Carbonate-Vitamin D (CALCIUM-VITAMIN D) 600-125 MG-UNIT TABS Take 1 tablet by mouth 2 (two) times daily.      carvedilol (COREG) 6.25 MG tablet TAKE 1 AND 1/2 TABLETS(9.375 MG) BY MOUTH TWICE DAILY 270 tablet 0   diclofenac Sodium (VOLTAREN) 1 % GEL Apply 2 g topically 4 (four) times daily. 50 g 0   fluticasone (CUTIVATE) 0.05 % cream Apply 1 application topically daily as needed (irritation).      fluticasone (FLONASE) 50 MCG/ACT nasal spray Place 2 sprays into both nostrils daily.     HYDROcodone-acetaminophen (NORCO/VICODIN) 5-325 MG tablet Take 1-2 tablets by mouth every 6 (six) hours as needed for moderate pain (pain score 4-6). 40 tablet 0   lisinopril (PRINIVIL,ZESTRIL) 20 MG tablet Take 20 mg by mouth daily.     Misc Natural Products (NARCOSOFT HERBAL LAX) CAPS Take 2 capsules by mouth every evening.     sertraline (ZOLOFT) 100 MG tablet Take 100 mg by mouth daily.     valACYclovir (VALTREX) 500 MG tablet Take 1 tablet by mouth as needed.     morphine (MSIR) 15 MG tablet Take 1 tablet (15 mg total) by mouth every 6 (six) hours as needed for severe pain. (Patient not taking: Reported on 06/19/2022) 10 tablet 0   No facility-administered medications prior to visit.    Review of Systems  Constitutional:  Negative for chills, diaphoresis, fever, malaise/fatigue and weight loss.  HENT:  Positive for congestion.   Respiratory:  Positive for cough. Negative for hemoptysis, sputum production, shortness of breath and wheezing.   Cardiovascular:  Negative for chest pain, palpitations and leg swelling.  Gastrointestinal:  Positive for heartburn.     Objective:   Vitals:   06/19/22 1325  BP: 124/86  Pulse: 61  SpO2: 97%  Weight: 128 lb 12.8 oz (58.4 kg)  Height: 5' (1.524 m)   SpO2: 97 % O2 Device: None (Room air)  Physical Exam: General: Well-appearing, no acute distress HENT: [place], AT Eyes: EOMI, no scleral  icterus Respiratory: Clear to auscultation bilaterally.  No crackles, wheezing or rales Cardiovascular: RRR, -M/R/G, no JVD Extremities:-Edema,-tenderness Neuro: AAO x4, CNII-XII grossly intact Psych: Normal mood, normal affect  Data Reviewed:  Imaging: CXR 01/29/19 - No infiltrate effusion or edema CT Chest 05/24/22 (report only)  IMPRESSION:  1. No significant focal opacities or consolidation.  2.  Minimal linear atelectasis/scarring in the posterior medial lung bases bilaterally.  3.  A 5 mm subpleural nodular density in the anterior right middle lobe. If the patient is high risk, consider optional follow-up chest CT in 12 months.  4.  Age  indeterminate moderate vertebral body compression deformity at L1, probably chronic.  5.  Other findings as described.   PFT: None on file  Labs: CBC    Component Value Date/Time   WBC 7.4 05/23/2019 1259   RBC 4.47 05/23/2019 1259   HGB 13.0 05/23/2019 1259   HCT 41.3 05/23/2019 1259   PLT 241 05/23/2019 1259   MCV 92.4 05/23/2019 1259   MCH 29.1 05/23/2019 1259   MCHC 31.5 05/23/2019 1259   RDW 14.8 05/23/2019 1259   LYMPHSABS 1.2 05/23/2019 1259   MONOABS 0.7 05/23/2019 1259   EOSABS 0.1 05/23/2019 1259   BASOSABS 0.0 05/23/2019 1259   Absolute 05/23/19 - 100     Assessment & Plan:   Discussion: 72 year old never smoker with HTN, depression, osteoporosis who presents for evaluation for chronic cough.  Common causes of cough were discussed including upper airway cough syndrome, reflux and undiagnosed obstructive lung disease.   Long standing cough that may be refractory to prior treatment. At this point may consider readdressing issues again including ACE inhibitor cessation and  aggressive upper airway management.  Chronic cough --ARRANGE for pulmonary function tests --START flonase TWO sprays per nare in the evening.  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 Never smoker  Orders Placed This Encounter  Procedures   Pulmonary function test    Standing Status:   Future    Number of Occurrences:   1    Standing Expiration Date:   06/20/2023    Scheduling Instructions:     Before and after    Order Specific Question:   Where should this test be performed?    Answer:   [facility]   Meds ordered this encounter  Medications   ipratropium (ATROVENT) 0.06 % nasal spray    Sig: Place 2 sprays into both nostrils daily.    Dispense:  15 mL    Refill:  5    Return in about 2 months (around 08/18/2022).  I have spent a total time of 45-minutes on the day of the appointment reviewing prior documentation, coordinating care and discussing medical diagnosis and plan with the patient/family. Imaging, labs and tests included in this note have been reviewed and interpreted independently by me.  [place], MD [facility] 06/19/2022 1:30 PM  Office Number [phone]

## 2022-06-19 NOTE — Patient Instructions (Signed)
Pre/Post Spirometry Performed Today. 

## 2022-06-19 NOTE — Progress Notes (Signed)
Pre/Post Spirometry Performed Today. 

## 2022-06-26 ENCOUNTER — Encounter (HOSPITAL_BASED_OUTPATIENT_CLINIC_OR_DEPARTMENT_OTHER): Payer: Self-pay | Admitting: Pulmonary Disease

## 2022-07-12 ENCOUNTER — Telehealth: Payer: Self-pay | Admitting: Pulmonary Disease

## 2022-07-12 NOTE — Telephone Encounter (Signed)
Fax received from Dr. Melrose Nakayama with [facility] to perform a Right Anterior Hip Arthroplasty on patient.  Patient needs surgery clearance. Surgery is PENDING. Patient was seen on 05/24/2022. Office protocol is a risk assessment can be sent to surgeon if patient has been seen in 60 days or less.   Sending to Dr Loanne Drilling for risk assessment or recommendations if patient needs to be seen in office prior to surgical procedure.

## 2022-07-16 NOTE — Telephone Encounter (Signed)
No separate appointment needed for surgical clearance. Patient seen in pulmonary clinic for chronic cough and not on any meds related to this. She is on nasal sprays to treat allergies.  From a pulmonary standpoint she is low risk. 1.6% risk of in-hospital post-op pulmonary complications (composite including respiratory failure, respiratory infection, pleural effusion, atelectasis, pneumothorax, bronchospasm, aspiration pneumonitis)

## 2022-07-17 NOTE — Telephone Encounter (Signed)
OV notes and clearance form have been faxed back to [facility]. Nothing further needed at this time.

## 2022-07-18 ENCOUNTER — Telehealth: Payer: Self-pay | Admitting: Pulmonary Disease

## 2022-07-18 NOTE — Telephone Encounter (Signed)
Left message with front desk advising surgical clearance notes were faxed yesterday

## 2022-07-24 NOTE — Telephone Encounter (Signed)
Closing encounter since OV notes have been received. NFN

## 2022-07-30 ENCOUNTER — Other Ambulatory Visit: Payer: Self-pay | Admitting: Orthopaedic Surgery

## 2022-08-05 NOTE — Progress Notes (Signed)
COVID Vaccine Completed: yes  Date of COVID positive in last 90 days:  PCP - Georgette Shell, PA Cardiologist -   Clearance by Chyrl Civatte 07/23/22 in Epic  Chest x-ray - CT 05/24/22 [facility] EKG - 07/23/22 [facility] Stress Test -  ECHO -  Cardiac Cath -  Pacemaker/ICD device last checked: Spinal Cord Stimulator:  Bowel Prep -   Sleep Study -  CPAP -   Fasting Blood Sugar -  Checks Blood Sugar _____ times a day  Last dose of GLP1 agonist-  N/A GLP1 instructions:  N/A   Last dose of SGLT-2 inhibitors-  N/A SGLT-2 instructions: N/A   Blood Thinner Instructions: Aspirin Instructions: Last Dose:  Activity level:  Can go up a flight of stairs and perform activities of daily living without stopping and without symptoms of chest pain or shortness of breath.  Able to exercise without symptoms  Unable to go up a flight of stairs without symptoms of     Anesthesia review:   Patient denies shortness of breath, fever, cough and chest pain at PAT appointment  Patient verbalized understanding of instructions that were given to them at the PAT appointment. Patient was also instructed that they will need to review over the PAT instructions again at home before surgery.

## 2022-08-05 NOTE — Patient Instructions (Signed)
SURGICAL WAITING ROOM VISITATION  Patients having surgery or a procedure may have no more than 2 support people in the waiting area - these visitors may rotate.    Children under the age of 84 must have an adult with them who is not the patient.  Due to an increase in RSV and influenza rates and associated hospitalizations, children ages 58 and under may not visit patients in [facility] hospitals.  If the patient needs to stay at the hospital during part of their recovery, the visitor guidelines for inpatient rooms apply. Pre-op nurse will coordinate an appropriate time for 1 support person to accompany patient in pre-op.  This support person may not rotate.    Please refer to the [facility] website for the visitor guidelines for Inpatients (after your surgery is over and you are in a regular room).    Your procedure is scheduled on: 08/20/22   Report to [facility] Main Entrance    Report to admitting at 9:50 AM   Call this number if you have problems the morning of surgery [phone]   Do not eat food :After Midnight.   After Midnight you may have the following liquids until 9:20 AM DAY OF SURGERY  Water Non-Citrus Juices (without pulp, NO RED-Apple, White grape, White cranberry) Black Coffee (NO MILK/CREAM OR CREAMERS, sugar ok)  Clear Tea (NO MILK/CREAM OR CREAMERS, sugar ok) regular and decaf                             Plain Jell-O (NO RED)                                           Fruit ices (not with fruit pulp, NO RED)                                     Popsicles (NO RED)                                                               Sports drinks like Gatorade (NO RED)                 The day of surgery:  Drink ONE (1) Pre-Surgery Clear Ensure at 9:20 AM the morning of surgery. Drink in one sitting. Do not sip.  This drink was given to you during your hospital  pre-op appointment visit. Nothing else to drink after completing the  Pre-Surgery Clear Ensure           If you have questions, please contact your surgeon's office.   FOLLOW BOWEL PREP AND ANY ADDITIONAL PRE OP INSTRUCTIONS YOU RECEIVED FROM YOUR SURGEON'S OFFICE!!!     Oral Hygiene is also important to reduce your risk of infection.                                    Remember - BRUSH YOUR TEETH THE MORNING OF SURGERY WITH YOUR REGULAR TOOTHPASTE  DENTURES WILL  BE REMOVED PRIOR TO SURGERY PLEASE DO NOT APPLY "Poly grip" OR ADHESIVES!!!   Take these medicines the morning of surgery with A SIP OF WATER: Amlodipine, Bupropion, Buspirone, Carvedilol, Atrovent, Pantoprazole, Sertraline                              You may not have any metal on your body including hair pins, jewelry, and body piercing             Do not wear make-up, lotions, powders, perfumes, or deodorant  Do not wear nail polish including gel and S&S, artificial/acrylic nails, or any other type of covering on natural nails including finger and toenails. If you have artificial nails, gel coating, etc. that needs to be removed by a nail salon please have this removed prior to surgery or surgery may need to be canceled/ delayed if the surgeon/ anesthesia feels like they are unable to be safely monitored.   Do not shave  48 hours prior to surgery.    Do not bring valuables to the hospital. [facility] IS NOT             RESPONSIBLE   FOR VALUABLES.   Contacts, glasses, dentures or bridgework may not be worn into surgery.  DO NOT BRING YOUR HOME MEDICATIONS TO THE HOSPITAL. PHARMACY WILL DISPENSE MEDICATIONS LISTED ON YOUR MEDICATION LIST TO YOU DURING YOUR ADMISSION IN THE HOSPITAL!    Patients discharged on the day of surgery will not be allowed to drive home.  Someone NEEDS to stay with you for the first 24 hours after anesthesia.              Please read over the following fact sheets you were given: IF YOU HAVE QUESTIONS ABOUT YOUR PRE-OP INSTRUCTIONS PLEASE CALL [phone] Contras    If you received a COVID  test during your pre-op visit  it is requested that you wear a mask when out in public, stay away from anyone that may not be feeling well and notify your surgeon if you develop symptoms. If you test positive for Covid or have been in contact with anyone that has tested positive in the last 10 days please notify you surgeon.  FAILURE TO FOLLOW THESE INSTRUCTIONS MAY RESULT IN THE CANCELLATION OF YOUR SURGERY  PATIENT SIGNATURE_________________________________  NURSE SIGNATURE__________________________________  ________________________________________________________________________ WHAT IS A BLOOD TRANSFUSION? Blood Transfusion Information  A transfusion is the replacement of blood or some of its parts. Blood is made up of multiple cells which provide different functions. Red blood cells carry oxygen and are used for blood loss replacement. White blood cells fight against infection. Platelets control bleeding. Plasma helps clot blood. Other blood products are available for specialized needs, such as hemophilia or other clotting disorders. BEFORE THE TRANSFUSION  Who gives blood for transfusions?  Healthy volunteers who are fully evaluated to make sure their blood is safe. This is blood bank blood. Transfusion therapy is the safest it has ever been in the practice of medicine. Before blood is taken from a donor, a complete history is taken to make sure that person has no history of diseases nor engages in risky social behavior (examples are intravenous drug use or sexual activity with multiple partners). The donor's travel history is screened to minimize risk of transmitting infections, such as malaria. The donated blood is tested for signs of infectious diseases, such as HIV and hepatitis. The blood is then tested to  be sure it is compatible with you in order to minimize the chance of a transfusion reaction. If you or a relative donates blood, this is often done in anticipation of surgery and is  not appropriate for emergency situations. It takes many days to process the donated blood. RISKS AND COMPLICATIONS Although transfusion therapy is very safe and saves many lives, the main dangers of transfusion include:  Getting an infectious disease. Developing a transfusion reaction. This is an allergic reaction to something in the blood you were given. Every precaution is taken to prevent this. The decision to have a blood transfusion has been considered carefully by your caregiver before blood is given. Blood is not given unless the benefits outweigh the risks. AFTER THE TRANSFUSION Right after receiving a blood transfusion, you will usually feel much better and more energetic. This is especially true if your red blood cells have gotten low (anemic). The transfusion raises the level of the red blood cells which carry oxygen, and this usually causes an energy increase. The nurse administering the transfusion will monitor you carefully for complications. HOME CARE INSTRUCTIONS  No special instructions are needed after a transfusion. You may find your energy is better. Speak with your caregiver about any limitations on activity for underlying diseases you may have. SEEK MEDICAL CARE IF:  Your condition is not improving after your transfusion. You develop redness or irritation at the intravenous (IV) site. SEEK IMMEDIATE MEDICAL CARE IF:  Any of the following symptoms occur over the next 12 hours: Shaking chills. You have a temperature by mouth above 102 F (38.9 C), not controlled by medicine. Chest, back, or muscle pain. People around you feel you are not acting correctly or are confused. Shortness of breath or difficulty breathing. Dizziness and fainting. You get a rash or develop hives. You have a decrease in urine output. Your urine turns a dark color or changes to pink, red, or brown. Any of the following symptoms occur over the next 10 days: You have a temperature by mouth above  102 F (38.9 C), not controlled by medicine. Shortness of breath. Weakness after normal activity. The white part of the eye turns yellow (jaundice). You have a decrease in the amount of urine or are urinating less often. Your urine turns a dark color or changes to pink, red, or brown. Document Released: 04/05/2000 Document Revised: 07/01/2011 Document Reviewed: 11/23/2007 [facility] Patient Information 2014 [place], [place].  _______________________________________________________________________

## 2022-08-07 ENCOUNTER — Encounter (HOSPITAL_COMMUNITY): Payer: Self-pay

## 2022-08-07 ENCOUNTER — Other Ambulatory Visit: Payer: Self-pay

## 2022-08-07 ENCOUNTER — Encounter (HOSPITAL_COMMUNITY)
Admission: RE | Admit: 2022-08-07 | Discharge: 2022-08-07 | Disposition: A | Discharge status: Home / Self Care | Payer: Medicare Other | Source: Ambulatory Visit | Attending: Orthopaedic Surgery | Admitting: Orthopaedic Surgery

## 2022-08-07 VITALS — BP 147/75 | HR 58 | Temp 98.4°F | Resp 16 | Ht 60.0 in | Wt 130.0 lb

## 2022-08-07 DIAGNOSIS — Z01812 Encounter for preprocedural laboratory examination: Secondary | ICD-10-CM | POA: Insufficient documentation

## 2022-08-07 DIAGNOSIS — Z01818 Encounter for other preprocedural examination: Secondary | ICD-10-CM

## 2022-08-07 HISTORY — DX: Gastro-esophageal reflux disease without esophagitis: K21.9

## 2022-08-07 LAB — TYPE AND SCREEN
ABO/RH(D): O POS
Antibody Screen: NEGATIVE

## 2022-08-07 LAB — SURGICAL PCR SCREEN
MRSA, PCR: NEGATIVE
Staphylococcus aureus: NEGATIVE

## 2022-08-15 NOTE — H&P (Signed)
TOTAL HIP ADMISSION H&P  Patient is admitted for right total hip arthroplasty.  Subjective:  Chief Complaint: right hip pain  HPI: Stephanie Kim, 72 y.o. female, has a history of pain and functional disability in the right hip(s) due to arthritis and patient has failed non-surgical conservative treatments for greater than 12 weeks to include NSAID's and/or analgesics, flexibility and strengthening excercises, supervised PT with diminished ADL's post treatment, use of assistive devices, weight reduction as appropriate, and activity modification.  Onset of symptoms was gradual starting 5 years ago with gradually worsening course since that time.The patient noted no past surgery on the right hip(s).  Patient currently rates pain in the right hip at 10 out of 10 with activity. Patient has night pain, worsening of pain with activity and weight bearing, trendelenberg gait, pain that interfers with activities of daily living, and crepitus. Patient has evidence of subchondral cysts, subchondral sclerosis, periarticular osteophytes, and joint space narrowing by imaging studies. This condition presents safety issues increasing the risk of falls. There is no current active infection.  Patient Active Problem List   Diagnosis Date Noted   Chronic cough 06/19/2022   Primary localized osteoarthritis of left hip 02/02/2019   Primary osteoarthritis of left hip 02/02/2019   Palpitations 06/04/2018   Hyperlipidemia 06/04/2018   Spondylolisthesis of lumbar region 10/25/2014   Past Medical History:  Diagnosis Date   Anxiety    Arthritis    Degenerative disc disease, cervical    Degenerative disc disease, lumbar    Depression    Dysrhythmia    palpitations according to her PA  Just comes and goes-placed on Metoprolo   Fibromyalgia    GERD (gastroesophageal reflux disease)    Headache    migraine  - last one was this past week   High cholesterol    High cholesterol    Hypertension    TMJ (dislocation  of temporomandibular joint)    POPS MAINLY    Past Surgical History:  Procedure Laterality Date   arm surgery Right    BACK SURGERY     x 4 times   BUNIONECTOMY     left foot.    bilateral, but she's not sure about right foot   EYE SURGERY     lasik   KNEE ARTHROSCOPY     left   ROTATOR CUFF REPAIR     cleaned out and removed spur   TONSILLECTOMY     TOTAL HIP ARTHROPLASTY Left 02/02/2019   Procedure: LEFT  HIP ARTHROPLASTY ANTERIOR APPROACH;  Surgeon: Marcene Corning, MD;  Location: [facility];  Service: Orthopedics;  Laterality: Left;   TUBAL LIGATION      No current facility-administered medications for this encounter.   Current Outpatient Medications  Medication Sig Dispense Refill Last Dose   amLODipine (NORVASC) 5 MG tablet Take 5 mg by mouth in the morning.      atorvastatin (LIPITOR) 10 MG tablet Take 10 mg by mouth at bedtime.       buPROPion (WELLBUTRIN XL) 150 MG 24 hr tablet Take 150 mg by mouth in the morning.      busPIRone (BUSPAR) 7.5 MG tablet Take 7.5 mg by mouth 2 (two) times daily.      Calcium Carbonate-Vitamin D (CALCIUM-VITAMIN D) 600-125 MG-UNIT TABS Take 1 tablet by mouth 2 (two) times daily.       carvedilol (COREG) 6.25 MG tablet TAKE 1 AND 1/2 TABLETS(9.375 MG) BY MOUTH TWICE DAILY (Patient taking differently: Take 6.25 mg by  mouth in the morning and at bedtime.) 270 tablet 0    CASCARA SAGRADA PO Take 2 capsules by mouth at bedtime. [employer] Herbal Laxative      cycloSPORINE (RESTASIS) 0.05 % ophthalmic emulsion Place 2 drops into both eyes 2 (two) times daily.      diclofenac Sodium (VOLTAREN) 1 % GEL Apply 2 g topically 4 (four) times daily. 50 g 0    fluticasone (FLONASE) 50 MCG/ACT nasal spray Place 2 sprays into both nostrils every evening.      ibuprofen (ADVIL) 200 MG tablet Take 400 mg by mouth in the morning.      ipratropium (ATROVENT) 0.06 % nasal spray Place 2 sprays into both nostrils daily. 15 mL 5    losartan (COZAAR) 50 MG tablet  Take 50 mg by mouth in the morning.      pantoprazole (PROTONIX) 40 MG tablet Take 40 mg by mouth in the morning.      sertraline (ZOLOFT) 100 MG tablet Take 100 mg by mouth in the morning.      valACYclovir (VALTREX) 500 MG tablet Take 500 mg by mouth daily as needed (cold sores.).      Allergies  Allergen Reactions   Atenolol Swelling    Throat swells    Oxycodone Itching   Alendronate Sodium Swelling    Felt like got stuck throat    Aspirin Swelling and Other (See Comments)    Also metallic taste     Levofloxacin Other (See Comments)    Blurred vision     Moxifloxacin Other (See Comments)    Blurred vision    Meloxicam Other (See Comments)    Hallucinations   Amoxicillin-Pot Clavulanate Photosensitivity and Other (See Comments)    Blurred vision    Social History   Tobacco Use   Smoking status: Never   Smokeless tobacco: Never  Substance Use Topics   Alcohol use: Yes    Alcohol/week: 10.0 standard drinks of alcohol    Types: 7 Glasses of wine, 3 Shots of liquor per week    Comment: weekly    Family History  Problem Relation Age of Onset   Hyperlipidemia Mother    Hypertension Mother    COPD Mother    Arthritis Mother    Heart attack Father    Hyperlipidemia Father    Heart attack Brother    Hyperlipidemia Brother    Hypertension Brother      Review of Systems  Musculoskeletal:  Positive for arthralgias.       Right hip  All other systems reviewed and are negative.   Objective:  Physical Exam Constitutional:      Appearance: Normal appearance.  HENT:     Head: Normocephalic and atraumatic.     Nose: Nose normal.     Mouth/Throat:     Pharynx: Oropharynx is clear.  Eyes:     Extraocular Movements: Extraocular movements intact.  Pulmonary:     Effort: Pulmonary effort is normal.  Abdominal:     Palpations: Abdomen is soft.  Musculoskeletal:     Cervical back: Normal range of motion.     Comments: Right hip motion is extremely painful and  limited.  Opposite hip moves well.  Leg lengths are equal.  She is walking with an altered gait.  Sensation and motor function are intact distally with palpable pulses in her feet.    Skin:    General: Skin is warm and dry.  Neurological:     General: No  focal deficit present.     Mental Status: She is alert and oriented to person, place, and time.  Psychiatric:        Mood and Affect: Mood normal.        Behavior: Behavior normal.        Thought Content: Thought content normal.        Judgment: Judgment normal.     Vital signs in last 24 hours:    Labs:   Estimated body mass index is 25.39 kg/m as calculated from the following:   Height as of 08/07/22: 5' (1.524 m).   Weight as of 08/07/22: 59 kg.   Imaging Review Plain radiographs demonstrate severe degenerative joint disease of the right hip(s). The bone quality appears to be good for age and reported activity level.      Assessment/Plan:  End stage primary arthritis, right hip(s)  The patient history, physical examination, clinical judgement of the provider and imaging studies are consistent with end stage degenerative joint disease of the right hip(s) and total hip arthroplasty is deemed medically necessary. The treatment options including medical management, injection therapy, arthroscopy and arthroplasty were discussed at length. The risks and benefits of total hip arthroplasty were presented and reviewed. The risks due to aseptic loosening, infection, stiffness, dislocation/subluxation,  thromboembolic complications and other imponderables were discussed.  The patient acknowledged the explanation, agreed to proceed with the plan and consent was signed. Patient is being admitted for inpatient treatment for surgery, pain control, PT, OT, prophylactic antibiotics, VTE prophylaxis, progressive ambulation and ADL's and discharge planning.The patient is planning to be discharged home with home health services

## 2022-08-17 ENCOUNTER — Encounter (HOSPITAL_BASED_OUTPATIENT_CLINIC_OR_DEPARTMENT_OTHER): Payer: Self-pay | Admitting: Pulmonary Disease

## 2022-08-20 ENCOUNTER — Ambulatory Visit (HOSPITAL_BASED_OUTPATIENT_CLINIC_OR_DEPARTMENT_OTHER): Payer: Medicare Other | Admitting: Registered Nurse

## 2022-08-20 ENCOUNTER — Ambulatory Visit (HOSPITAL_COMMUNITY): Payer: Medicare Other

## 2022-08-20 ENCOUNTER — Encounter (HOSPITAL_COMMUNITY): Payer: Self-pay | Admitting: Orthopaedic Surgery

## 2022-08-20 ENCOUNTER — Ambulatory Visit (HOSPITAL_COMMUNITY): Payer: Medicare Other | Admitting: Registered Nurse

## 2022-08-20 ENCOUNTER — Observation Stay (HOSPITAL_COMMUNITY)
Admission: RE | Admit: 2022-08-20 | Discharge: 2022-08-21 | Disposition: A | Discharge status: Home Health | Payer: Medicare Other | Source: Ambulatory Visit | Attending: Orthopaedic Surgery | Admitting: Orthopaedic Surgery

## 2022-08-20 ENCOUNTER — Other Ambulatory Visit: Payer: Self-pay

## 2022-08-20 ENCOUNTER — Encounter (HOSPITAL_COMMUNITY)
Admission: RE | Disposition: A | Discharge status: Home Health | Payer: Self-pay | Source: Ambulatory Visit | Attending: Orthopaedic Surgery

## 2022-08-20 DIAGNOSIS — M1611 Unilateral primary osteoarthritis, right hip: Secondary | ICD-10-CM

## 2022-08-20 DIAGNOSIS — I10 Essential (primary) hypertension: Secondary | ICD-10-CM | POA: Insufficient documentation

## 2022-08-20 DIAGNOSIS — F418 Other specified anxiety disorders: Secondary | ICD-10-CM

## 2022-08-20 DIAGNOSIS — Z79899 Other long term (current) drug therapy: Secondary | ICD-10-CM | POA: Insufficient documentation

## 2022-08-20 DIAGNOSIS — Z96642 Presence of left artificial hip joint: Secondary | ICD-10-CM | POA: Insufficient documentation

## 2022-08-20 HISTORY — PX: TOTAL HIP ARTHROPLASTY: SHX124

## 2022-08-20 SURGERY — ARTHROPLASTY, HIP, TOTAL, ANTERIOR APPROACH
Anesthesia: Spinal | Site: Hip | Laterality: Right

## 2022-08-20 MED ORDER — CARVEDILOL 6.25 MG PO TABS
6.2500 mg | ORAL_TABLET | Freq: Two times a day (BID) | ORAL | Status: DC
  Administered 2022-08-20 – 2022-08-21 (×2): 6.25 mg via ORAL
  Filled 2022-08-20 (×2): qty 1

## 2022-08-20 MED ORDER — CYCLOSPORINE 0.05 % OP EMUL
1.0000 [drp] | Freq: Two times a day (BID) | OPHTHALMIC | Status: DC
  Administered 2022-08-20 – 2022-08-21 (×2): 1 [drp] via OPHTHALMIC
  Filled 2022-08-20 (×2): qty 30

## 2022-08-20 MED ORDER — KETOROLAC TROMETHAMINE 15 MG/ML IJ SOLN
7.5000 mg | Freq: Four times a day (QID) | INTRAMUSCULAR | Status: DC
  Administered 2022-08-20 – 2022-08-21 (×3): 7.5 mg via INTRAVENOUS
  Filled 2022-08-20 (×3): qty 1

## 2022-08-20 MED ORDER — AMLODIPINE BESYLATE 5 MG PO TABS: 5.0000 mg | ORAL_TABLET | Freq: Every morning | ORAL | Status: DC

## 2022-08-20 MED ORDER — HYDROCODONE-ACETAMINOPHEN 7.5-325 MG PO TABS: 1.0000 | ORAL_TABLET | ORAL | Status: DC | PRN

## 2022-08-20 MED ORDER — SERTRALINE HCL 100 MG PO TABS
100.0000 mg | ORAL_TABLET | Freq: Every morning | ORAL | Status: DC
  Administered 2022-08-21: 100 mg via ORAL
  Filled 2022-08-20: qty 1

## 2022-08-20 MED ORDER — HYDROCODONE-ACETAMINOPHEN 5-325 MG PO TABS
1.0000 | ORAL_TABLET | ORAL | Status: DC | PRN
  Administered 2022-08-20 – 2022-08-21 (×3): 2 via ORAL
  Filled 2022-08-20 (×3): qty 2

## 2022-08-20 MED ORDER — PHENOL 1.4 % MT LIQD: 1.0000 | OROMUCOSAL | Status: DC | PRN

## 2022-08-20 MED ORDER — MIDAZOLAM HCL 2 MG/2ML IJ SOLN
INTRAMUSCULAR | Status: AC
  Filled 2022-08-20: qty 2

## 2022-08-20 MED ORDER — DOCUSATE SODIUM 100 MG PO CAPS
100.0000 mg | ORAL_CAPSULE | Freq: Two times a day (BID) | ORAL | Status: DC
  Administered 2022-08-20 – 2022-08-21 (×2): 100 mg via ORAL
  Filled 2022-08-20 (×2): qty 1

## 2022-08-20 MED ORDER — ACETAMINOPHEN 10 MG/ML IV SOLN
INTRAVENOUS | Status: DC | PRN
  Administered 2022-08-20: 1000 mg via INTRAVENOUS

## 2022-08-20 MED ORDER — ONDANSETRON HCL 4 MG/2ML IJ SOLN
INTRAMUSCULAR | Status: DC | PRN
  Administered 2022-08-20: 4 mg via INTRAVENOUS

## 2022-08-20 MED ORDER — TRANEXAMIC ACID-NACL 1000-0.7 MG/100ML-% IV SOLN
1000.0000 mg | INTRAVENOUS | Status: AC
  Administered 2022-08-20: 1000 mg via INTRAVENOUS
  Filled 2022-08-20: qty 100

## 2022-08-20 MED ORDER — LACTATED RINGERS IV SOLN: INTRAVENOUS | Status: DC

## 2022-08-20 MED ORDER — CEFAZOLIN SODIUM-DEXTROSE 2-4 GM/100ML-% IV SOLN
2.0000 g | INTRAVENOUS | Status: AC
  Administered 2022-08-20: 2 g via INTRAVENOUS
  Filled 2022-08-20: qty 100

## 2022-08-20 MED ORDER — AMISULPRIDE (ANTIEMETIC) 5 MG/2ML IV SOLN: 10.0000 mg | Freq: Once | INTRAVENOUS | Status: DC | PRN

## 2022-08-20 MED ORDER — BUPIVACAINE HCL (PF) 0.5 % IJ SOLN
INTRAMUSCULAR | Status: AC
  Filled 2022-08-20: qty 30

## 2022-08-20 MED ORDER — ONDANSETRON HCL 4 MG/2ML IJ SOLN
INTRAMUSCULAR | Status: AC
  Filled 2022-08-20: qty 2

## 2022-08-20 MED ORDER — TRANEXAMIC ACID 1000 MG/10ML IV SOLN
INTRAVENOUS | Status: DC | PRN
  Administered 2022-08-20: 2000 mg via TOPICAL

## 2022-08-20 MED ORDER — MENTHOL 3 MG MT LOZG: 1.0000 | LOZENGE | OROMUCOSAL | Status: DC | PRN

## 2022-08-20 MED ORDER — BUPIVACAINE LIPOSOME 1.3 % IJ SUSP
INTRAMUSCULAR | Status: DC | PRN
  Administered 2022-08-20: 40 mL

## 2022-08-20 MED ORDER — METOCLOPRAMIDE HCL 5 MG/ML IJ SOLN: 5.0000 mg | Freq: Three times a day (TID) | INTRAMUSCULAR | Status: DC | PRN

## 2022-08-20 MED ORDER — MIDAZOLAM HCL 5 MG/5ML IJ SOLN
INTRAMUSCULAR | Status: DC | PRN
  Administered 2022-08-20: 2 mg via INTRAVENOUS

## 2022-08-20 MED ORDER — DIPHENHYDRAMINE HCL 12.5 MG/5ML PO ELIX
12.5000 mg | ORAL_SOLUTION | ORAL | Status: DC | PRN
  Administered 2022-08-21: 25 mg via ORAL
  Filled 2022-08-20: qty 10

## 2022-08-20 MED ORDER — MORPHINE SULFATE (PF) 2 MG/ML IV SOLN
0.5000 mg | INTRAVENOUS | Status: DC | PRN
  Administered 2022-08-21: 1 mg via INTRAVENOUS
  Filled 2022-08-20 (×2): qty 1

## 2022-08-20 MED ORDER — METOCLOPRAMIDE HCL 5 MG PO TABS: 5.0000 mg | ORAL_TABLET | Freq: Three times a day (TID) | ORAL | Status: DC | PRN

## 2022-08-20 MED ORDER — ONDANSETRON HCL 4 MG PO TABS: 4.0000 mg | ORAL_TABLET | Freq: Four times a day (QID) | ORAL | Status: DC | PRN

## 2022-08-20 MED ORDER — BUPIVACAINE LIPOSOME 1.3 % IJ SUSP: 10.0000 mL | Freq: Once | INTRAMUSCULAR | Status: DC

## 2022-08-20 MED ORDER — TRANEXAMIC ACID-NACL 1000-0.7 MG/100ML-% IV SOLN
1000.0000 mg | Freq: Once | INTRAVENOUS | Status: AC
  Administered 2022-08-20: 1000 mg via INTRAVENOUS
  Filled 2022-08-20: qty 100

## 2022-08-20 MED ORDER — ORAL CARE MOUTH RINSE: 15.0000 mL | Freq: Once | OROMUCOSAL | Status: AC

## 2022-08-20 MED ORDER — TRANEXAMIC ACID 1000 MG/10ML IV SOLN
2000.0000 mg | INTRAVENOUS | Status: DC
  Filled 2022-08-20: qty 20

## 2022-08-20 MED ORDER — PHENYLEPHRINE HCL-NACL 20-0.9 MG/250ML-% IV SOLN
INTRAVENOUS | Status: DC | PRN
  Administered 2022-08-20: 25 ug/min via INTRAVENOUS

## 2022-08-20 MED ORDER — ATORVASTATIN CALCIUM 10 MG PO TABS
10.0000 mg | ORAL_TABLET | Freq: Every day | ORAL | Status: DC
  Administered 2022-08-20: 10 mg via ORAL
  Filled 2022-08-20: qty 1

## 2022-08-20 MED ORDER — LOSARTAN POTASSIUM 50 MG PO TABS: 50.0000 mg | ORAL_TABLET | Freq: Every morning | ORAL | Status: DC

## 2022-08-20 MED ORDER — DEXAMETHASONE SODIUM PHOSPHATE 10 MG/ML IJ SOLN
INTRAMUSCULAR | Status: AC
  Filled 2022-08-20: qty 1

## 2022-08-20 MED ORDER — METHOCARBAMOL 500 MG PO TABS
500.0000 mg | ORAL_TABLET | Freq: Four times a day (QID) | ORAL | Status: DC | PRN
  Administered 2022-08-20: 500 mg via ORAL
  Filled 2022-08-20: qty 1

## 2022-08-20 MED ORDER — CEFAZOLIN SODIUM-DEXTROSE 2-4 GM/100ML-% IV SOLN
2.0000 g | Freq: Four times a day (QID) | INTRAVENOUS | Status: AC
  Administered 2022-08-20 (×2): 2 g via INTRAVENOUS
  Filled 2022-08-20 (×2): qty 100

## 2022-08-20 MED ORDER — POVIDONE-IODINE 10 % EX SWAB: 2.0000 | Freq: Once | CUTANEOUS | Status: DC

## 2022-08-20 MED ORDER — BUPIVACAINE IN DEXTROSE 0.75-8.25 % IT SOLN
INTRATHECAL | Status: DC | PRN
  Administered 2022-08-20: 1.8 mL via INTRATHECAL

## 2022-08-20 MED ORDER — ONDANSETRON HCL 4 MG/2ML IJ SOLN: 4.0000 mg | Freq: Four times a day (QID) | INTRAMUSCULAR | Status: DC | PRN

## 2022-08-20 MED ORDER — ASPIRIN 81 MG PO CHEW
81.0000 mg | CHEWABLE_TABLET | Freq: Two times a day (BID) | ORAL | Status: DC
Start: 2022-08-21 — End: 2022-08-20

## 2022-08-20 MED ORDER — PROPOFOL 1000 MG/100ML IV EMUL
INTRAVENOUS | Status: AC
  Filled 2022-08-20: qty 100

## 2022-08-20 MED ORDER — FENTANYL CITRATE (PF) 100 MCG/2ML IJ SOLN
INTRAMUSCULAR | Status: AC
  Filled 2022-08-20: qty 2

## 2022-08-20 MED ORDER — FENTANYL CITRATE (PF) 100 MCG/2ML IJ SOLN
INTRAMUSCULAR | Status: DC | PRN
  Administered 2022-08-20: 50 ug via INTRAVENOUS

## 2022-08-20 MED ORDER — STERILE WATER FOR IRRIGATION IR SOLN
Status: DC | PRN
  Administered 2022-08-20: 2000 mL

## 2022-08-20 MED ORDER — PANTOPRAZOLE SODIUM 40 MG PO TBEC
40.0000 mg | DELAYED_RELEASE_TABLET | Freq: Every morning | ORAL | Status: DC
  Administered 2022-08-21: 40 mg via ORAL
  Filled 2022-08-20: qty 1

## 2022-08-20 MED ORDER — PROPOFOL 500 MG/50ML IV EMUL
INTRAVENOUS | Status: DC | PRN
  Administered 2022-08-20: 40 ug/kg/min via INTRAVENOUS

## 2022-08-20 MED ORDER — METHOCARBAMOL 1000 MG/10ML IJ SOLN: 500.0000 mg | Freq: Four times a day (QID) | INTRAVENOUS | Status: DC | PRN

## 2022-08-20 MED ORDER — ALUM & MAG HYDROXIDE-SIMETH 200-200-20 MG/5ML PO SUSP: 30.0000 mL | ORAL | Status: DC | PRN

## 2022-08-20 MED ORDER — BUPIVACAINE LIPOSOME 1.3 % IJ SUSP
INTRAMUSCULAR | Status: AC
  Filled 2022-08-20: qty 10

## 2022-08-20 MED ORDER — BISACODYL 5 MG PO TBEC: 5.0000 mg | DELAYED_RELEASE_TABLET | Freq: Every day | ORAL | Status: DC | PRN

## 2022-08-20 MED ORDER — 0.9 % SODIUM CHLORIDE (POUR BTL) OPTIME
TOPICAL | Status: DC | PRN
  Administered 2022-08-20: 1000 mL

## 2022-08-20 MED ORDER — IPRATROPIUM BROMIDE 0.06 % NA SOLN
2.0000 | Freq: Every day | NASAL | Status: DC
  Administered 2022-08-21: 2 via NASAL
  Filled 2022-08-20: qty 15

## 2022-08-20 MED ORDER — ACETAMINOPHEN 500 MG PO TABS
500.0000 mg | ORAL_TABLET | Freq: Four times a day (QID) | ORAL | Status: DC
  Administered 2022-08-20 – 2022-08-21 (×3): 500 mg via ORAL
  Filled 2022-08-20 (×5): qty 1

## 2022-08-20 MED ORDER — ACETAMINOPHEN 325 MG PO TABS: 325.0000 mg | ORAL_TABLET | Freq: Four times a day (QID) | ORAL | Status: DC | PRN

## 2022-08-20 MED ORDER — DEXAMETHASONE SODIUM PHOSPHATE 10 MG/ML IJ SOLN
INTRAMUSCULAR | Status: DC | PRN
  Administered 2022-08-20: 8 mg via INTRAVENOUS

## 2022-08-20 MED ORDER — ACETAMINOPHEN 500 MG PO TABS: 1000.0000 mg | ORAL_TABLET | Freq: Once | ORAL | Status: DC

## 2022-08-20 MED ORDER — APIXABAN 2.5 MG PO TABS
2.5000 mg | ORAL_TABLET | Freq: Two times a day (BID) | ORAL | Status: DC
  Administered 2022-08-21: 2.5 mg via ORAL
  Filled 2022-08-20: qty 1

## 2022-08-20 MED ORDER — ACETAMINOPHEN 10 MG/ML IV SOLN
INTRAVENOUS | Status: AC
  Filled 2022-08-20: qty 100

## 2022-08-20 MED ORDER — CHLORHEXIDINE GLUCONATE 0.12 % MT SOLN
15.0000 mL | Freq: Once | OROMUCOSAL | Status: AC
  Administered 2022-08-20: 15 mL via OROMUCOSAL

## 2022-08-20 MED ORDER — HYDROMORPHONE HCL 1 MG/ML IJ SOLN: 0.2500 mg | INTRAMUSCULAR | Status: DC | PRN

## 2022-08-20 MED ORDER — BUSPIRONE HCL 5 MG PO TABS
7.5000 mg | ORAL_TABLET | Freq: Two times a day (BID) | ORAL | Status: DC
  Administered 2022-08-20 – 2022-08-21 (×2): 7.5 mg via ORAL
  Filled 2022-08-20 (×2): qty 2

## 2022-08-20 MED ORDER — BUPROPION HCL ER (XL) 150 MG PO TB24
150.0000 mg | ORAL_TABLET | Freq: Every morning | ORAL | Status: DC
  Administered 2022-08-21: 150 mg via ORAL
  Filled 2022-08-20: qty 1

## 2022-08-20 SURGICAL SUPPLY — 50 items
BAG COUNTER SPONGE SURGICOUNT (BAG) IMPLANT
BAG DECANTER FOR FLEXI CONT (MISCELLANEOUS) ×1 IMPLANT
BAG SPNG CNTER NS LX DISP (BAG)
BLADE SAW SGTL 18X1.27X75 (BLADE) ×1 IMPLANT
BOOTIES KNEE HIGH SLOAN (MISCELLANEOUS) ×1 IMPLANT
CELLS DAT CNTRL 66122 CELL SVR (MISCELLANEOUS) ×1 IMPLANT
COVER PERINEAL POST (MISCELLANEOUS) ×1 IMPLANT
COVER SURGICAL LIGHT HANDLE (MISCELLANEOUS) ×1 IMPLANT
CUP GRIPTON 48MM 100 HIP (Hips) IMPLANT
DRAPE FOOT SWITCH (DRAPES) ×1 IMPLANT
DRAPE IMP U-DRAPE 54X76 (DRAPES) ×1 IMPLANT
DRAPE STERI IOBAN 125X83 (DRAPES) ×1 IMPLANT
DRAPE U-SHAPE 47X51 STRL (DRAPES) ×2 IMPLANT
DRSG AQUACEL AG ADV 3.5X 6 (GAUZE/BANDAGES/DRESSINGS) ×1 IMPLANT
DURAPREP 26ML APPLICATOR (WOUND CARE) ×1 IMPLANT
ELECT BLADE TIP CTD 4 INCH (ELECTRODE) ×1 IMPLANT
ELECT REM PT RETURN 15FT ADLT (MISCELLANEOUS) ×1 IMPLANT
ELIMINATOR HOLE APEX DEPUY (Hips) IMPLANT
GLOVE BIO SURGEON STRL SZ8 (GLOVE) ×2 IMPLANT
GLOVE BIOGEL PI IND STRL 7.0 (GLOVE) ×1 IMPLANT
GLOVE BIOGEL PI IND STRL 8 (GLOVE) ×2 IMPLANT
GLOVE SURG SYN 7.0 (GLOVE) ×1 IMPLANT
GLOVE SURG SYN 7.0 PF PI (GLOVE) ×1 IMPLANT
GOWN SRG XL LVL 4 BRTHBL STRL (GOWNS) ×1 IMPLANT
GOWN STRL NON-REIN XL LVL4 (GOWNS) ×1
GOWN STRL REUS W/ TWL XL LVL3 (GOWN DISPOSABLE) ×2 IMPLANT
GOWN STRL REUS W/TWL XL LVL3 (GOWN DISPOSABLE) ×2
HEAD FEMORAL 32 CERAMIC (Hips) IMPLANT
HOLDER FOLEY CATH W/STRAP (MISCELLANEOUS) ×1 IMPLANT
KIT TURNOVER KIT A (KITS) IMPLANT
LINER ACET 32X48 (Liner) IMPLANT
MANIFOLD NEPTUNE II (INSTRUMENTS) ×1 IMPLANT
NDL HYPO 22X1.5 SAFETY MO (MISCELLANEOUS) ×1 IMPLANT
NEEDLE HYPO 22X1.5 SAFETY MO (MISCELLANEOUS) ×1 IMPLANT
NS IRRIG 1000ML POUR BTL (IV SOLUTION) ×1 IMPLANT
PACK ANTERIOR HIP CUSTOM (KITS) ×1 IMPLANT
PENCIL SMOKE EVACUATOR (MISCELLANEOUS) IMPLANT
PROTECTOR NERVE ULNAR (MISCELLANEOUS) ×1 IMPLANT
RETRACTOR WND ALEXIS 18 MED (MISCELLANEOUS) ×1 IMPLANT
RTRCTR WOUND ALEXIS 18CM MED (MISCELLANEOUS) ×1
SPIKE FLUID TRANSFER (MISCELLANEOUS) ×1 IMPLANT
STEM FEM ACTIS STD SZ7 (Nail) IMPLANT
SUT ETHIBOND NAB CT1 #1 30IN (SUTURE) ×2 IMPLANT
SUT VIC AB 1 CT1 36 (SUTURE) ×1 IMPLANT
SUT VIC AB 2-0 CT1 27 (SUTURE) ×1
SUT VIC AB 2-0 CT1 TAPERPNT 27 (SUTURE) ×1 IMPLANT
SUT VICRYL AB 3-0 FS1 BRD 27IN (SUTURE) ×1 IMPLANT
SUT VLOC 180 0 24IN GS25 (SUTURE) ×1 IMPLANT
SYR 50ML LL SCALE MARK (SYRINGE) ×1 IMPLANT
TRAY FOLEY MTR SLVR 16FR STAT (SET/KITS/TRAYS/PACK) ×1 IMPLANT

## 2022-08-20 NOTE — Discharge Instructions (Signed)

## 2022-08-20 NOTE — Anesthesia Procedure Notes (Signed)
Procedure Name: MAC Date/Time: 08/20/2022 12:27 PM  Performed by: Elisabeth Cara, CRNAPre-anesthesia Checklist: Patient identified, Emergency Drugs available, Suction available, Patient being monitored and Timeout performed Patient Re-evaluated:Patient Re-evaluated prior to induction Oxygen Delivery Method: Simple face mask Placement Confirmation: positive ETCO2 Dental Injury: Teeth and Oropharynx as per pre-operative assessment

## 2022-08-20 NOTE — Interval H&P Note (Signed)
History and Physical Interval Note:  08/20/2022 11:33 AM  Stephanie Kim  has presented today for surgery, with the diagnosis of right hip degenerative joint disease.  The various methods of treatment have been discussed with the patient and family. After consideration of risks, benefits and other options for treatment, the patient has consented to  Procedure(s): RIGHT TOTAL HIP ARTHROPLASTY ANTERIOR APPROACH (Right) as a surgical intervention.  The patient's history has been reviewed, patient examined, no change in status, stable for surgery.  I have reviewed the patient's chart and labs.  Questions were answered to the patient's satisfaction.     Velna Ochs

## 2022-08-20 NOTE — Anesthesia Preprocedure Evaluation (Addendum)
Anesthesia Evaluation  Patient identified by MRN, date of birth, ID band Patient awake    Reviewed: Allergy & Precautions, NPO status , Patient's Chart, lab work & pertinent test results  Airway Mallampati: II  TM Distance: >3 FB Neck ROM: Full    Dental no notable dental hx. (+) Dental Advisory Given, Teeth Intact   Pulmonary neg pulmonary ROS   Pulmonary exam normal breath sounds clear to auscultation       Cardiovascular hypertension, Pt. on medications Normal cardiovascular exam+ dysrhythmias  Rhythm:Regular Rate:Normal     Neuro/Psych  Headaches PSYCHIATRIC DISORDERS Anxiety Depression       GI/Hepatic negative GI ROS, Neg liver ROS,,,  Endo/Other  negative endocrine ROS    Renal/GU negative Renal ROS  negative genitourinary   Musculoskeletal DJD   Abdominal Normal abdominal exam  (+)   Peds  Hematology negative hematology ROS (+)   Anesthesia Other Findings   Reproductive/Obstetrics                             Anesthesia Physical Anesthesia Plan  ASA: 2  Anesthesia Plan: Spinal   Post-op Pain Management: Tylenol PO (pre-op)*   Induction:   PONV Risk Score and Plan: 2 and Ondansetron and Dexamethasone  Airway Management Planned: Natural Airway, Nasal Cannula and Simple Face Mask  Additional Equipment: None  Intra-op Plan:   Post-operative Plan:   Informed Consent: I have reviewed the patients History and Physical, chart, labs and discussed the procedure including the risks, benefits and alternatives for the proposed anesthesia with the patient or authorized representative who has indicated his/her understanding and acceptance.     Dental advisory given  Plan Discussed with: CRNA  Anesthesia Plan Comments:         Anesthesia Quick Evaluation

## 2022-08-20 NOTE — Anesthesia Procedure Notes (Addendum)
Spinal  Patient location during procedure: OR Start time: 08/20/2022 12:31 PM End time: 08/20/2022 12:35 PM Reason for block: surgical anesthesia Staffing Performed: anesthesiologist  Anesthesiologist: [name], MD Performed by: [name], MD Authorized by: [name], MD   Preanesthetic Checklist Completed: patient identified, IV checked, site marked, risks and benefits discussed, surgical consent, monitors and equipment checked, pre-op evaluation and timeout performed Spinal Block Patient position: sitting Prep: DuraPrep and site prepped and draped Patient monitoring: heart rate, continuous pulse ox and blood pressure Approach: left paramedian Location: L2-3 Injection technique: single-shot Needle Needle type: Spinocan  Needle gauge: 25 G Needle length: 9 cm Additional Notes Expiration date of kit checked and confirmed. Patient tolerated procedure well, without complications.

## 2022-08-20 NOTE — Transfer of Care (Signed)
Immediate Anesthesia Transfer of Care Note  Patient: Stephanie Kim  Procedure(s) Performed: RIGHT TOTAL HIP ARTHROPLASTY ANTERIOR APPROACH (Right: Hip)  Patient Location: PACU  Anesthesia Type:MAC and Spinal  Level of Consciousness: awake, alert , oriented, and patient cooperative  Airway & Oxygen Therapy: Patient Spontanous Breathing and Patient connected to face mask oxygen  Post-op Assessment: Report given to RN and Post -op Vital signs reviewed and stable  Post vital signs: Reviewed and stable  Last Vitals:  Vitals Value Taken Time  BP 108/69 08/20/22 1401  Temp    Pulse 66 08/20/22 1403  Resp 15 08/20/22 1403  SpO2 100 % 08/20/22 1403  Vitals shown include unvalidated device data.  Last Pain:  Vitals:   08/20/22 1036  TempSrc:   PainSc: 6       Patients Stated Pain Goal: 5 (08/20/22 1036)  Complications: No notable events documented.

## 2022-08-20 NOTE — Op Note (Signed)
PRE-OP DIAGNOSIS:  RIGHT HIP DEGENERATIVE JOINT DISEASE POST-OP DIAGNOSIS:  same PROCEDURE: RIGHT TOTAL HIP ARTHROPLASTY ANTERIOR APPROACH ANESTHESIA:  Spinal and MAC SURGEON:  Marcene Corning MD ASSISTANT:  Elodia Florence PA-C   INDICATIONS FOR PROCEDURE:  The patient is a 72 y.o. female with a long history of a painful hip.  This has persisted despite multiple conservative measures.  The patient has persisted with pain and dysfunction making rest and activity difficult.  A total hip replacement is offered as surgical treatment.  Informed operative consent was obtained after discussion of possible complications including reaction to anesthesia, infection, neurovascular injury, dislocation, DVT, PE, and death.  The importance of the postoperative rehab program to optimize result was stressed with the patient.  SUMMARY OF FINDINGS AND PROCEDURE:  Under the above anesthesia through a anterior approach an the Hana table a right THR was performed.  The patient had severe degenerative change and good bone quality.  We used DePuy components to replace the hip and these were size 7 Actis femur capped with a +1 32mm ceramic hip ball.  On the acetabular side we used a size 48 Gription shell with a  plus 0 neutral polyethylene liner.  We did use a hole eliminator.  Elodia Florence PA-C assisted throughout and was invaluable to the completion of the case in that he helped position and retract while I performed the procedure.  He also closed simultaneously to help minimize OR time.  I used fluoroscopy throughout the case to check position of implants and leg lengths and read all of these views myself.  DESCRIPTION OF PROCEDURE:  The patient was taken to the OR suite where the above anesthetic was applied.  The patient was then positioned on the Hana table supine.  All bony prominences were appropriately padded.  Prep and drape was then performed in normal sterile fashion.  The patient was given kefzol preoperative  antibiotic and an appropriate time out was performed.  We then took an anterior approach to the right hip.  Dissection was taken through adipose to the tensor fascia lata fascia.  This structure was incised longitudinally and we dissected in the intermuscular interval just medial to this muscle.  Cobra retractors were placed superior and inferior to the femoral neck superficial to the capsule.  A capsular incision was then made and the retractors were placed along the femoral neck.  Xray was brought in to get a good level for the femoral neck cut which was made with an oscillating saw and osteotome.  The femoral head was removed with a corkscrew.  The acetabulum was exposed and some labral tissues were excised. Reaming was taken to the inside wall of the pelvis and sequentially up to 1 mm smaller than the actual component.  A trial of components was done and then the aforementioned acetabular shell was placed in appropriate tilt and anteversion confirmed by fluoroscopy. The liner was placed along with the hole eliminator and attention was turned to the femur.  The leg was brought down and over into adduction and the elevator bar was used to raise the femur up gently in the wound.  The piriformis was released with care taken to preserve the obturator internus attachment and all of the posterior capsule. The femur was reamed and then broached to the appropriate size.  A trial reduction was done and the aforementioned head and neck assembly gave [place] the best stability in extension with external rotation.  Leg lengths were felt to be about equal  by fluoroscopic exam.  The trial components were removed and the wound irrigated.  We then placed the femoral component in appropriate anteversion.  The head was applied to a dry stem neck and the hip again reduced.  It was again stable in the aforementioned position.  The would was irrigated again followed by re-approximation of anterior capsule with ethibond suture. Tensor  fascia was repaired with V-loc suture  followed by deep closure with #O and #2 undyed vicryl.  Skin was closed with subQ stitch and steristrips followed by a sterile dressing.  EBL and IOF can be obtained from anesthesia records.  DISPOSITION:  The patient was taken to PACU in stable condition to potentially go home same day depending on ability to walk and tolerate liquids.

## 2022-08-21 ENCOUNTER — Encounter (HOSPITAL_BASED_OUTPATIENT_CLINIC_OR_DEPARTMENT_OTHER): Payer: Self-pay

## 2022-08-21 ENCOUNTER — Encounter (HOSPITAL_COMMUNITY): Payer: Self-pay | Admitting: Orthopaedic Surgery

## 2022-08-21 ENCOUNTER — Ambulatory Visit (HOSPITAL_BASED_OUTPATIENT_CLINIC_OR_DEPARTMENT_OTHER): Payer: PRIVATE HEALTH INSURANCE | Admitting: Pulmonary Disease

## 2022-08-21 DIAGNOSIS — M1611 Unilateral primary osteoarthritis, right hip: Secondary | ICD-10-CM | POA: Diagnosis not present

## 2022-08-21 MED ORDER — HYDROCODONE-ACETAMINOPHEN 5-325 MG PO TABS: 1.0000 | ORAL_TABLET | Freq: Four times a day (QID) | ORAL | 0 refills | Status: AC | PRN

## 2022-08-21 MED ORDER — APIXABAN 2.5 MG PO TABS: 2.5000 mg | ORAL_TABLET | Freq: Two times a day (BID) | ORAL | 0 refills | Status: AC

## 2022-08-21 MED ORDER — TIZANIDINE HCL 4 MG PO TABS: 4.0000 mg | ORAL_TABLET | Freq: Four times a day (QID) | ORAL | 1 refills | Status: AC | PRN

## 2022-08-21 NOTE — Anesthesia Postprocedure Evaluation (Signed)
Anesthesia Post Note  Patient: Stephanie Kim  Procedure(s) Performed: RIGHT TOTAL HIP ARTHROPLASTY ANTERIOR APPROACH (Right: Hip)     Patient location during evaluation: PACU Anesthesia Type: Spinal Level of consciousness: sedated and patient cooperative Pain management: pain level controlled Vital Signs Assessment: post-procedure vital signs reviewed and stable Respiratory status: spontaneous breathing Cardiovascular status: stable Anesthetic complications: no   No notable events documented.  Last Vitals:  Vitals:   08/21/22 0648 08/21/22 0959  BP: 106/67 92/64  Pulse: 69 69  Resp: 16 18  Temp: 36.9 C 36.6 C  SpO2: 96% 93%    Last Pain:  Vitals:   08/21/22 0959  TempSrc: Oral  PainSc:                  [name]

## 2022-08-21 NOTE — Progress Notes (Signed)
Subjective: 1 Day Post-Op Procedure(s) (LRB): RIGHT TOTAL HIP ARTHROPLASTY ANTERIOR APPROACH (Right)  Patient doing well. Pain better today. Hoping to go home today.  Activity level:  wbat Diet tolerance:  ok Voiding:  ok Patient reports pain as mild.    Objective: Vital signs in last 24 hours: Temp:  [96.8 F (36 C)-98.5 F (36.9 C)] 98.4 F (36.9 C) (05/01 0648) Pulse Rate:  [58-74] 69 (05/01 0648) Resp:  [10-23] 16 (05/01 0648) BP: (93-124)/(65-100) 106/67 (05/01 0648) SpO2:  [94 %-100 %] 96 % (05/01 0648) Weight:  [59 kg] 59 kg (04/30 1036)  Labs: No results for input(s): "HGB" in the last 72 hours. No results for input(s): "WBC", "RBC", "HCT", "PLT" in the last 72 hours. No results for input(s): "NA", "K", "CL", "CO2", "BUN", "CREATININE", "GLUCOSE", "CALCIUM" in the last 72 hours. No results for input(s): "LABPT", "INR" in the last 72 hours.  Physical Exam:  Neurologically intact ABD soft Neurovascular intact Sensation intact distally Intact pulses distally Dorsiflexion/Plantar flexion intact Incision: dressing C/D/I and no drainage No cellulitis present Compartment soft  Assessment/Plan:  1 Day Post-Op Procedure(s) (LRB): RIGHT TOTAL HIP ARTHROPLASTY ANTERIOR APPROACH (Right) Advance diet Up with therapy D/C IV fluids Discharge home with home health today if cleared by PT and doing well.  Eliquis 2.5 bid for dvt prevention. Follow up in office 2 weeks post op.  Ginger Organ Yoni Lobos 08/21/2022, 7:57 AM

## 2022-08-21 NOTE — Evaluation (Addendum)
Physical Therapy Evaluation Patient Details Name: Stephanie Kim MRN: [id number] DOB: 1951-03-15 Today's Date: 08/21/2022  History of Present Illness  72 yo female presents to therapy s/p R THA, anterior approach on 08/20/2022 due to failure of conservative measures. Pt has PMH including but not limited to: HTL, DJD of cervical and lumbar region, dysrhythmia, HTN, HDL, fibromyalgia, back sx, RTC repair and L THA, AA (2020).  Clinical Impression  Pt is s/p THA resulting in the deficits listed below (see PT Problem List). At baseline, pt is independent.  She has RW, home support, but does have a flight of steps to enter.  Today, pt with good pain control and was able to ambulate 150' with RW and performed stairs without difficulty with min guard.  Pt demonstrates safe gait & transfers in order to return home from PT perspective once discharged by MD.  While in hospital, will continue to benefit from PT for skilled therapy to advance mobility and exercises.   .         Recommendations for follow up therapy are one component of a multi-disciplinary discharge planning process, led by the attending physician.  Recommendations may be updated based on patient status, additional functional criteria and insurance authorization.  Follow Up Recommendations       Assistance Recommended at Discharge Intermittent Supervision/Assistance  Patient can return home with the following  A little help with walking and/or transfers;A little help with bathing/dressing/bathroom;Assistance with cooking/housework;Help with stairs or ramp for entrance    Equipment Recommendations None recommended by PT  Recommendations for Other Services       Functional Status Assessment Patient has had a recent decline in their functional status and demonstrates the ability to make significant improvements in function in a reasonable and predictable amount of time.     Precautions / Restrictions Precautions Precautions:  Fall Restrictions Weight Bearing Restrictions: Yes RLE Weight Bearing: Weight bearing as tolerated      Mobility  Bed Mobility Overal bed mobility: Needs Assistance Bed Mobility: Supine to Sit     Supine to sit: Min assist     General bed mobility comments: Light min A for R LE with cues for gait belt to assist    Transfers Overall transfer level: Needs assistance Equipment used: Rolling walker (2 wheels) Transfers: Sit to/from Stand Sit to Stand: Min guard           General transfer comment: cues for hand placement and R LE management    Ambulation/Gait Ambulation/Gait assistance: Min guard, Supervision Gait Distance (Feet): 150 Feet Assistive device: Rolling walker (2 wheels) Gait Pattern/deviations: Step-through pattern Gait velocity: decreased     General Gait Details: Min guard with cues for RW proximity progressing to supervision and good proximity  Stairs Stairs: Yes Stairs assistance: Min guard Stair Management: Step to pattern, Forwards Number of Stairs: 6 General stair comments: Cued for "up with good and down with bad" and pt able to perform 6 steps without difficulty with bil rails.  Pt had pictures of stairs.  She could go in back where she parks with flight of steps bil rails.  Or she could walk around the house to 6 steps , 1 rail, but uphill and unlevel walk.  With ease that pt did stairs advised just doing the flight with bil rails and supervision  Wheelchair Mobility    Modified Rankin (Stroke Patients Only)       Balance Overall balance assessment: Needs assistance Sitting-balance support: No upper extremity supported Sitting  balance-Leahy Scale: Good     Standing balance support: No upper extremity supported, Bilateral upper extremity supported Standing balance-Leahy Scale: Fair Standing balance comment: RW to ambulate but could static stand without support                             Pertinent Vitals/Pain Pain  Assessment Pain Assessment: 0-10 Pain Score: 3  Pain Location: R hip Pain Descriptors / Indicators: Discomfort Pain Intervention(s): Limited activity within patient's tolerance, Monitored during session    Home Living Family/patient expects to be discharged to:: Private residence Living Arrangements: Spouse/significant other Available Help at Discharge: Family;Available 24 hours/day Type of Home: House Home Access: Stairs to enter Entrance Stairs-Rails: Doctor, general practice of Steps: Flight from garage; [employer] only has 6 steps but has to walk along [place] sidewalk   Home Layout: One level Home Equipment: Cane - single Librarian, academic (2 wheels);BSC/3in1;Grab bars - tub/shower      Prior Function Prior Level of Function : Independent/Modified Independent;Driving             Mobility Comments: could ambulate in community without ad ADLs Comments: independent with adls and iadls     Hand Dominance        Extremity/Trunk Assessment   Upper Extremity Assessment Upper Extremity Assessment: Overall WFL for tasks assessed    Lower Extremity Assessment Lower Extremity Assessment: LLE deficits/detail;RLE deficits/detail RLE Deficits / Details: Expected post op changes; ROM WFL; MMT: ankle 5/5, knee 3/5 , hip 2/5 LLE Deficits / Details: ROM WFL; MMT 5/5    Cervical / Trunk Assessment Cervical / Trunk Assessment: Normal  Communication   Communication: No difficulties  Cognition Arousal/Alertness: Awake/alert Behavior During Therapy: WFL for tasks assessed/performed Overall Cognitive Status: Within Functional Limits for tasks assessed                                          General Comments  Educated on safe ice use, no pivots, car transfers, resting with leg straight, Also, encouraged walking every 1-2 hours during day. Educated on HEP with focus on mobility the first weeks. Discussed doing exercises within pain control and if pain  increasing could decreased ROM, reps, and stop exercises as needed. Encouraged to perform and ankle pumps frequently for blood flow.   RN reported pt with soft BP.  BP monitored and as follows: Supine 111/80 Sit 107/73 Stand 101/71 Post walk 111/74 Pt denies lightheadedness    Exercises Total Joint Exercises Ankle Circles/Pumps: AROM, 10 reps, Supine, Both Quad Sets: AROM, Both, 10 reps, Supine Heel Slides: AAROM, Right, 10 reps, Supine Hip ABduction/ADduction: AAROM, Right, 10 reps, Supine Long Arc Quad: AROM, Right, 10 reps, Seated Other Exercises Other Exercises: Insturcted for exercises to tolerance and AAROM techniques with gait belt Other Exercises: Also performed 2 reps standing R LE with min guard and RW: hip flex, hip ext, hip abd, hamstring curl   Assessment/Plan    PT Assessment Patient needs continued PT services  PT Problem List Decreased strength;Decreased range of motion;Decreased activity tolerance;Decreased balance;Decreased mobility;Decreased knowledge of precautions;Pain       PT Treatment Interventions DME instruction;Gait training;Stair training;Functional mobility training;Therapeutic activities;Patient/family education;Modalities;Balance training;Therapeutic exercise    PT Goals (Current goals can be found in the Care Plan section)  Acute Rehab PT Goals Patient Stated Goal: return home PT Goal Formulation:  With patient Time For Goal Achievement: 09/04/22 Potential to Achieve Goals: Good    Frequency 7X/week     Co-evaluation               AM-PAC PT "6 Clicks" Mobility  Outcome Measure Help needed turning from your back to your side while in a flat bed without using bedrails?: A Little Help needed moving from lying on your back to sitting on the side of a flat bed without using bedrails?: A Little Help needed moving to and from a bed to a chair (including a wheelchair)?: A Little Help needed standing up from a chair using your arms (e.g.,  wheelchair or bedside chair)?: A Little Help needed to walk in hospital room?: A Little Help needed climbing 3-5 steps with a railing? : A Little 6 Click Score: 18    End of Session Equipment Utilized During Treatment: Gait belt Activity Tolerance: Patient tolerated treatment well Patient left: with chair alarm set;in chair;with call bell/phone within reach Nurse Communication: Mobility status PT Visit Diagnosis: Other abnormalities of gait and mobility (R26.89);Muscle weakness (generalized) (M62.81)    Time: 1610-9604 PT Time Calculation (min) (ACUTE ONLY): 43 min   Charges:   PT Evaluation $PT Eval Low Complexity: 1 Low PT Treatments $Gait Training: 8-22 mins $Therapeutic Exercise: 8-22 mins        Anise Salvo, PT Acute Rehab [facility] [phone]   Rayetta Humphrey 08/21/2022, 2:21 PM

## 2022-08-21 NOTE — TOC Transition Note (Signed)
Transition of Care [facility]) - CM/SW Discharge Note   Patient Details  Name: Stephanie Kim MRN: [id number] Date of Birth: 03-28-1951  [facility] CM/SW Contact:  Amada Jupiter, LCSW Phone Number: 08/21/2022, 10:50 AM   Clinical Narrative:     Met with pt and confirming she has needed DME at home.  HHPT prearranged with [facility].  No further TOC needs.  Final next level of care: Home w Home Health Services Barriers to Discharge: No Barriers Identified   Patient Goals and CMS Choice      Discharge Placement                         Discharge Plan and Services Additional resources added to the After Visit Summary for                  DME Arranged: N/A DME Agency: NA       HH Arranged: PT HH Agency: [facility]        Social Determinants of Health (SDOH) Interventions SDOH Screenings   Food Insecurity: No Food Insecurity (08/20/2022)  Housing: Low Risk  (08/20/2022)  Transportation Needs: No Transportation Needs (08/20/2022)  Utilities: Not At Risk (08/20/2022)  Tobacco Use: Low Risk  (08/21/2022)     Readmission Risk Interventions     No data to display

## 2022-08-21 NOTE — Discharge Summary (Signed)
Patient ID: Stephanie Kim MRN: [id number] DOB/AGE: [birth year] 72 y.o.  Admit date: 08/20/2022 Discharge date: 08/21/2022  Admission Diagnoses:  Principal Problem:   Primary osteoarthritis of right hip   Discharge Diagnoses:  Same  Past Medical History:  Diagnosis Date   Anxiety    Arthritis    Degenerative disc disease, cervical    Degenerative disc disease, lumbar    Depression    Dysrhythmia    palpitations according to her PA  Just comes and goes-placed on Metoprolo   Fibromyalgia    GERD (gastroesophageal reflux disease)    Headache    migraine  - last one was this past week   High cholesterol    High cholesterol    Hypertension    TMJ (dislocation of temporomandibular joint)    POPS MAINLY    Surgeries: Procedure(s): RIGHT TOTAL HIP ARTHROPLASTY ANTERIOR APPROACH on 08/20/2022   Consultants:   Discharged Condition: Improved  Hospital Course: Stephanie Kim is an 72 y.o. female who was admitted 08/20/2022 for operative treatment ofPrimary osteoarthritis of right hip. Patient has severe unremitting pain that affects sleep, daily activities, and work/hobbies. After pre-op clearance the patient was taken to the operating room on 08/20/2022 and underwent  Procedure(s): RIGHT TOTAL HIP ARTHROPLASTY ANTERIOR APPROACH.    Patient was given perioperative antibiotics:  Anti-infectives (From admission, onward)    Start     Dose/Rate Route Frequency Ordered Stop   08/20/22 1830  ceFAZolin (ANCEF) IVPB 2g/100 mL premix        2 g 200 mL/hr over 30 Minutes Intravenous Every 6 hours 08/20/22 1716 08/21/22 0019   08/20/22 1030  ceFAZolin (ANCEF) IVPB 2g/100 mL premix        2 g 200 mL/hr over 30 Minutes Intravenous On call to O.R. 08/20/22 1021 08/20/22 1306        Patient was given sequential compression devices, early ambulation, and chemoprophylaxis to prevent DVT.  Patient benefited maximally from hospital stay and there were no complications.    Recent vital  signs: Patient Vitals for the past 24 hrs:  BP Temp Temp src Pulse Resp SpO2 Height Weight  08/21/22 0648 106/67 98.4 F (36.9 C) -- 69 16 96 % -- --  08/21/22 0145 120/68 97.6 F (36.4 C) -- 64 16 98 % -- --  08/20/22 2159 108/65 98.5 F (36.9 C) -- 74 16 96 % -- --  08/20/22 1851 115/78 98.3 F (36.8 C) Oral -- 16 97 % -- --  08/20/22 1717 109/73 98.4 F (36.9 C) Oral -- 16 96 % -- --  08/20/22 1645 94/71 -- -- 61 12 95 % -- --  08/20/22 1630 105/72 98.2 F (36.8 C) -- 65 15 97 % -- --  08/20/22 1615 99/72 -- -- 62 10 96 % -- --  08/20/22 1600 93/69 -- -- 62 15 94 % -- --  08/20/22 1545 102/72 -- -- 61 12 100 % -- --  08/20/22 1530 (!) 117/100 -- -- (!) 58 14 100 % -- --  08/20/22 1515 109/71 -- -- 60 18 100 % -- --  08/20/22 1500 110/75 -- -- 67 12 100 % -- --  08/20/22 1445 103/72 -- -- 60 16 97 % -- --  08/20/22 1430 94/68 -- -- 62 (!) 21 100 % -- --  08/20/22 1415 93/78 -- -- 62 (!) 23 98 % -- --  08/20/22 1400 108/69 (!) 96.8 F (36 C) -- 67 -- 100 % -- --  08/20/22  1036 -- -- -- -- -- -- 5' (1.524 m) 59 kg  08/20/22 1034 124/73 98.2 F (36.8 C) Oral 63 16 96 % -- --     Recent laboratory studies: No results for input(s): "WBC", "HGB", "HCT", "PLT", "NA", "K", "CL", "CO2", "BUN", "CREATININE", "GLUCOSE", "INR", "CALCIUM" in the last 72 hours.  Invalid input(s): "PT", "2"   Discharge Medications:   Allergies as of 08/21/2022       Reactions   Atenolol Swelling   Throat swells   Oxycodone Itching   Alendronate Sodium Swelling   Felt like got stuck throat   Aspirin Swelling, Other (See Comments)   Also metallic taste    Levofloxacin Other (See Comments)   Blurred vision   Moxifloxacin Other (See Comments)   Blurred vision   Meloxicam Other (See Comments)   Hallucinations   Amoxicillin-pot Clavulanate Photosensitivity, Other (See Comments)   Blurred vision        Medication List     STOP taking these medications    ibuprofen 200 MG tablet Commonly  known as: ADVIL       TAKE these medications    amLODipine 5 MG tablet Commonly known as: NORVASC Take 5 mg by mouth in the morning.   apixaban 2.5 MG Tabs tablet Commonly known as: Eliquis Take 1 tablet (2.5 mg total) by mouth 2 (two) times daily. For DVT prevention after surgery   atorvastatin 10 MG tablet Commonly known as: LIPITOR Take 10 mg by mouth at bedtime.   buPROPion 150 MG 24 hr tablet Commonly known as: WELLBUTRIN XL Take 150 mg by mouth in the morning.   busPIRone 7.5 MG tablet Commonly known as: BUSPAR Take 7.5 mg by mouth 2 (two) times daily.   Calcium-Vitamin D 600-125 MG-UNIT Tabs Take 1 tablet by mouth 2 (two) times daily.   carvedilol 6.25 MG tablet Commonly known as: COREG TAKE 1 AND 1/2 TABLETS(9.375 MG) BY MOUTH TWICE DAILY What changed: See the new instructions.   CASCARA SAGRADA PO Take 2 capsules by mouth at bedtime. [employer]bal Laxative   cycloSPORINE 0.05 % ophthalmic emulsion Commonly known as: RESTASIS Place 2 drops into both eyes 2 (two) times daily.   diclofenac Sodium 1 % Gel Commonly known as: Voltaren Apply 2 g topically 4 (four) times daily.   fluticasone 50 MCG/ACT nasal spray Commonly known as: FLONASE Place 2 sprays into both nostrils every evening.   HYDROcodone-acetaminophen 5-325 MG tablet Commonly known as: NORCO/VICODIN Take 1-2 tablets by mouth every 6 (six) hours as needed for moderate pain (post op pain).   ipratropium 0.06 % nasal spray Commonly known as: ATROVENT Place 2 sprays into both nostrils daily.   losartan 50 MG tablet Commonly known as: COZAAR Take 50 mg by mouth in the morning.   pantoprazole 40 MG tablet Commonly known as: PROTONIX Take 40 mg by mouth in the morning.   sertraline 100 MG tablet Commonly known as: ZOLOFT Take 100 mg by mouth in the morning.   tiZANidine 4 MG tablet Commonly known as: Zanaflex Take 1 tablet (4 mg total) by mouth every 6 (six) hours as needed for  muscle spasms.   valACYclovir 500 MG tablet Commonly known as: VALTREX Take 500 mg by mouth daily as needed (cold sores.).               Durable Medical Equipment  (From admission, onward)           Start     Ordered  08/20/22 1713  DME Walker rolling  Once       Question:  Patient needs a walker to treat with the following condition  Answer:  Primary osteoarthritis of right hip   08/20/22 1716   08/20/22 1713  DME 3 n 1  Once        08/20/22 1716   08/20/22 1713  DME Bedside commode  Once       Question:  Patient needs a bedside commode to treat with the following condition  Answer:  Primary osteoarthritis of right hip   08/20/22 1716            Diagnostic Studies: DG HIP UNILAT WITH PELVIS 2-3 VIEWS LEFT  Result Date: 08/20/2022 CLINICAL DATA:  Provided history: Surgery, elective. Provided fluoroscopy time: 13 seconds (1.9 mGy). EXAM: DG HIP (WITH OR WITHOUT PELVIS) 2-3V RIGHT COMPARISON:  CT abdomen/pelvis 05/23/2019. FINDINGS: Two intraoperative fluoroscopic images of the right hip are submitted. On the provided images, there are findings of right total hip arthroplasty. The femoral and acetabular components appear well seated. No unexpected finding on the provided views. Sequelae of prior left hip arthroplasty, incompletely imaged. IMPRESSION: Two intraoperative fluoroscopic images from right total hip arthroplasty, as described. Electronically Signed   By: Jackey Loge D.O.   On: 08/20/2022 14:23   DG C-Arm 1-60 Min-No Report  Result Date: 08/20/2022 Fluoroscopy was utilized by the requesting physician.  No radiographic interpretation.    Disposition: Discharge disposition: 01-Home or Self Care       Discharge Instructions     Call MD / Call 911   Complete by: As directed    If you experience chest pain or shortness of breath, CALL 911 and be transported to the hospital emergency room.  If you develope a fever above 101 F, pus (white drainage) or  increased drainage or redness at the wound, or calf pain, call your surgeon's office.   Constipation Prevention   Complete by: As directed    Drink plenty of fluids.  Prune juice may be helpful.  You may use a stool softener, such as Colace (over the counter) 100 mg twice a day.  Use MiraLax (over the counter) for constipation as needed.   Diet - low sodium heart healthy   Complete by: As directed    Discharge instructions   Complete by: As directed    INSTRUCTIONS AFTER JOINT REPLACEMENT   Remove items at home which could result in a fall. This includes throw rugs or furniture in walking pathways ICE to the affected joint every three hours while awake for 30 minutes at a time, for at least the first 3-5 days, and then as needed for pain and swelling.  Continue to use ice for pain and swelling. You may notice swelling that will progress down to the foot and ankle.  This is normal after surgery.  Elevate your leg when you are not up walking on it.   Continue to use the breathing machine you got in the hospital (incentive spirometer) which will help keep your temperature down.  It is common for your temperature to cycle up and down following surgery, especially at night when you are not up moving around and exerting yourself.  The breathing machine keeps your lungs expanded and your temperature down.   DIET:  As you were doing prior to hospitalization, we recommend a well-balanced diet.  DRESSING / WOUND CARE / SHOWERING  You may shower 3 days after surgery, but keep the wounds  dry during showering.  You may use an occlusive plastic wrap (Press'n Seal for example), NO SOAKING/SUBMERGING IN THE BATHTUB.  If the bandage gets wet, change with a clean dry gauze.  If the incision gets wet, pat the wound dry with a clean towel.  ACTIVITY  Increase activity slowly as tolerated, but follow the weight bearing instructions below.   No driving for 6 weeks or until further direction given by your  physician.  You cannot drive while taking narcotics.  No lifting or carrying greater than 10 lbs. until further directed by your surgeon. Avoid periods of inactivity such as sitting longer than an hour when not asleep. This helps prevent blood clots.  You may return to work once you are authorized by your doctor.     WEIGHT BEARING   Weight bearing as tolerated with assist device (walker, cane, etc) as directed, use it as long as suggested by your surgeon or therapist, typically at least 4-6 weeks.   EXERCISES  Results after joint replacement surgery are often greatly improved when you follow the exercise, range of motion and muscle strengthening exercises prescribed by your doctor. Safety measures are also important to protect the joint from further injury. Any time any of these exercises cause you to have increased pain or swelling, decrease what you are doing until you are comfortable again and then slowly increase them. If you have problems or questions, call your caregiver or physical therapist for advice.   Rehabilitation is important following a joint replacement. After just a few days of immobilization, the muscles of the leg can become weakened and shrink (atrophy).  These exercises are designed to build up the tone and strength of the thigh and leg muscles and to improve motion. Often times heat used for twenty to thirty minutes before working out will loosen up your tissues and help with improving the range of motion but do not use heat for the first two weeks following surgery (sometimes heat can increase post-operative swelling).   These exercises can be done on a training (exercise) mat, on the floor, on a table or on a bed. Use whatever works the best and is most comfortable for you.    Use music or television while you are exercising so that the exercises are a pleasant break in your day. This will make your life better with the exercises acting as a break in your routine that you  can look forward to.   Perform all exercises about fifteen times, three times per day or as directed.  You should exercise both the operative leg and the other leg as well.  Exercises include:   Quad Sets - Tighten up the muscle on the front of the thigh (Quad) and hold for 5-10 seconds.   Straight Leg Raises - With your knee straight (if you were given a brace, keep it on), lift the leg to 60 degrees, hold for 3 seconds, and slowly lower the leg.  Perform this exercise against resistance later as your leg gets stronger.  Leg Slides: Lying on your back, slowly slide your foot toward your buttocks, bending your knee up off the floor (only go as far as is comfortable). Then slowly slide your foot back down until your leg is flat on the floor again.  Angel Wings: Lying on your back spread your legs to the side as far apart as you can without causing discomfort.  Hamstring Strength:  Lying on your back, push your heel against the floor  with your leg straight by tightening up the muscles of your buttocks.  Repeat, but this time bend your knee to a comfortable angle, and push your heel against the floor.  You may put a pillow under the heel to make it more comfortable if necessary.   A rehabilitation program following joint replacement surgery can speed recovery and prevent re-injury in the future due to weakened muscles. Contact your doctor or a physical therapist for more information on knee rehabilitation.    CONSTIPATION  Constipation is defined medically as fewer than three stools per week and severe constipation as less than one stool per week.  Even if you have a regular bowel pattern at home, your normal regimen is likely to be disrupted due to multiple reasons following surgery.  Combination of anesthesia, postoperative narcotics, change in appetite and fluid intake all can affect your bowels.   YOU MUST use at least one of the following options; they are listed in order of increasing strength to  get the job done.  They are all available over the counter, and you may need to use some, POSSIBLY even all of these options:    Drink plenty of fluids (prune juice may be helpful) and high fiber foods Colace 100 mg by mouth twice a day  Senokot for constipation as directed and as needed Dulcolax (bisacodyl), take with full glass of water  Miralax (polyethylene glycol) once or twice a day as needed.  If you have tried all these things and are unable to have a bowel movement in the first 3-4 days after surgery call either your surgeon or your primary doctor.    If you experience loose stools or diarrhea, hold the medications until you stool forms back up.  If your symptoms do not get better within 1 week or if they get worse, check with your doctor.  If you experience "the worst abdominal pain ever" or develop nausea or vomiting, please contact the office immediately for further recommendations for treatment.   ITCHING:  If you experience itching with your medications, try taking only a single pain pill, or even half a pain pill at a time.  You can also use Benadryl over the counter for itching or also to help with sleep.   TED HOSE STOCKINGS:  Use stockings on both legs until for at least 2 weeks or as directed by physician office. They may be removed at night for sleeping.  MEDICATIONS:  See your medication summary on the "After Visit Summary" that nursing will review with you.  You may have some home medications which will be placed on hold until you complete the course of blood thinner medication.  It is important for you to complete the blood thinner medication as prescribed.  PRECAUTIONS:  If you experience chest pain or shortness of breath - call 911 immediately for transfer to the hospital emergency department.   If you develop a fever greater that 101 F, purulent drainage from wound, increased redness or drainage from wound, foul odor from the wound/dressing, or calf pain - CONTACT YOUR  SURGEON.                                                   FOLLOW-UP APPOINTMENTS:  If you do not already have a post-op appointment, please call the office for an appointment to be  seen by your surgeon.  Guidelines for how soon to be seen are listed in your "After Visit Summary", but are typically between 1-4 weeks after surgery.  OTHER INSTRUCTIONS:   Knee Replacement:  Do not place pillow under knee, focus on keeping the knee straight while resting. CPM instructions: 0-90 degrees, 2 hours in the morning, 2 hours in the afternoon, and 2 hours in the evening. Place foam block, curve side up under heel at all times except when in CPM or when walking.  DO NOT modify, tear, cut, or change the foam block in any way.  POST-OPERATIVE OPIOID TAPER INSTRUCTIONS: It is important to wean off of your opioid medication as soon as possible. If you do not need pain medication after your surgery it is ok to stop day one. Opioids include: Codeine, Hydrocodone(Norco, Vicodin), Oxycodone(Percocet, oxycontin) and hydromorphone amongst others.  Long term and even short term use of opiods can cause: Increased pain response Dependence Constipation Depression Respiratory depression And more.  Withdrawal symptoms can include Flu like symptoms Nausea, vomiting And more Techniques to manage these symptoms Hydrate well Eat regular healthy meals Stay active Use relaxation techniques(deep breathing, meditating, yoga) Do Not substitute Alcohol to help with tapering If you have been on opioids for less than two weeks and do not have pain than it is ok to stop all together.  Plan to wean off of opioids This plan should start within one week post op of your joint replacement. Maintain the same interval or time between taking each dose and first decrease the dose.  Cut the total daily intake of opioids by one tablet each day Next start to increase the time between doses. The last dose that should be eliminated is  the evening dose.     MAKE SURE YOU:  Understand these instructions.  Get help right away if you are not doing well or get worse.    Thank you for letting us be a part of your medical care team.  It is a privilege we respect greatly.  We hope these instructions will help you stay on track for a fast and full recovery!   Increase activity slowly as tolerated   Complete by: As directed    Post-operative opioid taper instructions:   Complete by: As directed    POST-OPERATIVE OPIOID TAPER INSTRUCTIONS: It is important to wean off of your opioid medication as soon as possible. If you do not need pain medication after your surgery it is ok to stop day one. Opioids include: Codeine, Hydrocodone(Norco, Vicodin), Oxycodone(Percocet, oxycontin) and hydromorphone amongst others.  Long term and even short term use of opiods can cause: Increased pain response Dependence Constipation Depression Respiratory depression And more.  Withdrawal symptoms can include Flu like symptoms Nausea, vomiting And more Techniques to manage these symptoms Hydrate well Eat regular healthy meals Stay active Use relaxation techniques(deep breathing, meditating, yoga) Do Not substitute Alcohol to help with tapering If you have been on opioids for less than two weeks and do not have pain than it is ok to stop all together.  Plan to wean off of opioids This plan should start within one week post op of your joint replacement. Maintain the same interval or time between taking each dose and first decrease the dose.  Cut the total daily intake of opioids by one tablet each day Next start to increase the time between doses. The last dose that should be eliminated is the evening dose.  Follow-up Information     Marcene Corning, MD. Schedule an appointment as soon as possible for a visit in 2 week(s).   Specialty: Orthopedic Surgery Contact information: [address] [place]  [address] [phone]                  Signed: Ginger Organ Levada Bowersox 08/21/2022, 8:03 AM

## 2022-08-22 ENCOUNTER — Emergency Department (HOSPITAL_COMMUNITY)
Admission: EM | Admit: 2022-08-22 | Discharge: 2022-08-22 | Disposition: A | Discharge status: Home / Self Care | Payer: Medicare Other | Attending: Emergency Medicine | Admitting: Emergency Medicine

## 2022-08-22 ENCOUNTER — Other Ambulatory Visit: Payer: Self-pay

## 2022-08-22 ENCOUNTER — Emergency Department (HOSPITAL_COMMUNITY): Payer: Medicare Other

## 2022-08-22 ENCOUNTER — Encounter (HOSPITAL_COMMUNITY): Payer: Self-pay

## 2022-08-22 DIAGNOSIS — R0602 Shortness of breath: Secondary | ICD-10-CM | POA: Diagnosis present

## 2022-08-22 DIAGNOSIS — D649 Anemia, unspecified: Secondary | ICD-10-CM | POA: Diagnosis not present

## 2022-08-22 DIAGNOSIS — Z7901 Long term (current) use of anticoagulants: Secondary | ICD-10-CM | POA: Insufficient documentation

## 2022-08-22 DIAGNOSIS — Z96649 Presence of unspecified artificial hip joint: Secondary | ICD-10-CM | POA: Insufficient documentation

## 2022-08-22 DIAGNOSIS — D72829 Elevated white blood cell count, unspecified: Secondary | ICD-10-CM | POA: Diagnosis not present

## 2022-08-22 DIAGNOSIS — Z79899 Other long term (current) drug therapy: Secondary | ICD-10-CM | POA: Diagnosis not present

## 2022-08-22 DIAGNOSIS — J90 Pleural effusion, not elsewhere classified: Secondary | ICD-10-CM | POA: Diagnosis not present

## 2022-08-22 LAB — CBC WITH DIFFERENTIAL/PLATELET
Abs Immature Granulocytes: 0.03 10*3/uL (ref 0.00–0.07)
Basophils Absolute: 0 10*3/uL (ref 0.0–0.1)
Basophils Relative: 0 %
Eosinophils Absolute: 0.1 10*3/uL (ref 0.0–0.5)
Eosinophils Relative: 1 %
HCT: 30 % — ABNORMAL LOW (ref 36.0–46.0)
Hemoglobin: 9.7 g/dL — ABNORMAL LOW (ref 12.0–15.0)
Immature Granulocytes: 0 %
Lymphocytes Relative: 12 %
Lymphs Abs: 1.6 10*3/uL (ref 0.7–4.0)
MCH: 31 pg (ref 26.0–34.0)
MCHC: 32.3 g/dL (ref 30.0–36.0)
MCV: 95.8 fL (ref 80.0–100.0)
Monocytes Absolute: 1.4 10*3/uL — ABNORMAL HIGH (ref 0.1–1.0)
Monocytes Relative: 11 %
Neutro Abs: 9.5 10*3/uL — ABNORMAL HIGH (ref 1.7–7.7)
Neutrophils Relative %: 76 %
Platelets: 208 10*3/uL (ref 150–400)
RBC: 3.13 MIL/uL — ABNORMAL LOW (ref 3.87–5.11)
RDW: 13.3 % (ref 11.5–15.5)
WBC: 12.6 10*3/uL — ABNORMAL HIGH (ref 4.0–10.5)
nRBC: 0 % (ref 0.0–0.2)

## 2022-08-22 LAB — BASIC METABOLIC PANEL
Anion gap: 9 (ref 5–15)
BUN: 12 mg/dL (ref 8–23)
CO2: 26 mmol/L (ref 22–32)
Calcium: 8.1 mg/dL — ABNORMAL LOW (ref 8.9–10.3)
Chloride: 102 mmol/L (ref 98–111)
Creatinine, Ser: 0.75 mg/dL (ref 0.44–1.00)
GFR, Estimated: >60 mL/min (ref >60)
Glucose, Bld: 111 mg/dL — ABNORMAL HIGH (ref 70–99)
Potassium: 3.7 mmol/L (ref 3.5–5.1)
Sodium: 137 mmol/L (ref 135–145)

## 2022-08-22 LAB — TROPONIN I (HIGH SENSITIVITY)
Troponin I (High Sensitivity): 35 ng/L — ABNORMAL HIGH (ref <18)
Troponin I (High Sensitivity): 28 ng/L — ABNORMAL HIGH (ref <18)

## 2022-08-22 LAB — BRAIN NATRIURETIC PEPTIDE: B Natriuretic Peptide: 411.5 pg/mL — ABNORMAL HIGH (ref 0.0–100.0)

## 2022-08-22 LAB — D-DIMER, QUANTITATIVE: D-Dimer, Quant: 1.32 ug/mL-FEU — ABNORMAL HIGH (ref 0.00–0.50)

## 2022-08-22 MED ORDER — SODIUM CHLORIDE (PF) 0.9 % IJ SOLN
INTRAMUSCULAR | Status: AC
  Filled 2022-08-22: qty 50

## 2022-08-22 MED ORDER — IOHEXOL 350 MG/ML SOLN
75.0000 mL | Freq: Once | INTRAVENOUS | Status: AC | PRN
  Administered 2022-08-22: 75 mL via INTRAVENOUS

## 2022-08-22 MED ORDER — SODIUM CHLORIDE 0.9 % IV BOLUS
500.0000 mL | Freq: Once | INTRAVENOUS | Status: AC
  Administered 2022-08-22: 500 mL via INTRAVENOUS

## 2022-08-22 NOTE — ED Provider Notes (Signed)
Patient seen after prior ED provider.  Patient is improved.  Patient desires discharge home.  Patient is up and walking around the department without significant difficulty.  Patient's vitals are without significant abnormality just prior to discharge.  Patient does understand the need to be extremely judicial when taking muscle relaxants and pain medications concurrently.  Importance of close follow-up stressed.  Strict return precautions given and understood.   Wynetta Fines, MD 08/22/22 [phone]

## 2022-08-22 NOTE — ED Triage Notes (Signed)
Pt coming from home. Pt had hip replacement surgery on Tuesday, today when the home PT arrived they noted the pts room air saturation was 80%, and pt endorsed SOB with activity. Pt states she took 2 hydrocodone, and one trazodone this morning for pain.

## 2022-08-22 NOTE — ED Provider Notes (Signed)
[place] EMERGENCY DEPARTMENT AT [facility] Provider Note   CSN: [id number] Arrival date & time: 08/22/22  1255     History  Chief Complaint  Patient presents with   Shortness of Breath    Stephanie Kim is a 72 y.o. female continued with concern for hypoxia.  Patient had a hip replacement performed 3 days ago.  He was discharged home with chronic pain medications, and hydrocodone as well as Eliquis for DVT prophylaxis.  She reports she has been taking the hydrocodone, took 2 tablets this morning as well as trazodone, large and has been very sleepy all day.  I was concerned that the patient's oxygen saturation was in low at home.  EMS reports home O2 sat at 80% on room air.   On arrival the patient says that she does not feel short of breath, only tired.  She says she does not typically take narcotic medications.  She denies any preceding issues or pulmonary disease, or smoking history.  I spoke to [place] her husband by phone, who confirms that patient was taking Norco "one or two tablets at a time every 6 hours" and also Zanaflex.   HPI     Home Medications Prior to Admission medications   Medication Sig Start Date End Date Taking? Authorizing Provider  amLODipine (NORVASC) 5 MG tablet Take 5 mg by mouth in the morning. 03/30/18   [provider]  apixaban (ELIQUIS) 2.5 MG TABS tablet Take 1 tablet (2.5 mg total) by mouth 2 (two) times daily. For DVT prevention after surgery 08/21/22   Elodia Florence, PA-C  atorvastatin (LIPITOR) 10 MG tablet Take 10 mg by mouth at bedtime.     [provider]  buPROPion (WELLBUTRIN XL) 150 MG 24 hr tablet Take 150 mg by mouth in the morning. 10/14/13   [provider]  busPIRone (BUSPAR) 7.5 MG tablet Take 7.5 mg by mouth 2 (two) times daily. 01/29/18   [provider]  Calcium Carbonate-Vitamin D (CALCIUM-VITAMIN D) 600-125 MG-UNIT TABS Take 1 tablet by mouth 2 (two) times daily.     [provider]  carvedilol (COREG) 6.25 MG tablet TAKE 1 AND 1/2 TABLETS(9.375 MG) BY MOUTH TWICE DAILY Patient taking differently: Take 6.25 mg by mouth in the morning and at bedtime. 01/05/20   Yates Decamp, MD  CASCARA SAGRADA PO Take 2 capsules by mouth at bedtime. [employer] Herbal Laxative    [provider]  cycloSPORINE (RESTASIS) 0.05 % ophthalmic emulsion Place 2 drops into both eyes 2 (two) times daily.    [provider]  diclofenac Sodium (VOLTAREN) 1 % GEL Apply 2 g topically 4 (four) times daily. 05/23/19   Pricilla Loveless, MD  fluticasone (FLONASE) 50 MCG/ACT nasal spray Place 2 sprays into both nostrils every evening.    [provider]  HYDROcodone-acetaminophen (NORCO/VICODIN) 5-325 MG tablet Take 1-2 tablets by mouth every 6 (six) hours as needed for moderate pain (post op pain). 08/21/22   Elodia Florence, PA-C  ipratropium (ATROVENT) 0.06 % nasal spray Place 2 sprays into both nostrils daily. 06/19/22   Luciano Cutter, MD  losartan (COZAAR) 50 MG tablet Take 50 mg by mouth in the morning. 07/08/22   [provider]  pantoprazole (PROTONIX) 40 MG tablet Take 40 mg by mouth in the morning.    [provider]  sertraline (ZOLOFT) 100 MG tablet Take 100 mg by mouth in the morning. 10/14/13   [provider]  tiZANidine (ZANAFLEX)  4 MG tablet Take 1 tablet (4 mg total) by mouth every 6 (six) hours as needed for muscle spasms. 08/21/22 08/21/23  Elodia Florence, PA-C  valACYclovir (VALTREX) 500 MG tablet Take 500 mg by mouth daily as needed (cold sores.). 11/09/16   [provider]      Allergies    Atenolol, Oxycodone, Alendronate sodium, Aspirin, Levofloxacin, Moxifloxacin, Meloxicam, and Amoxicillin-pot clavulanate    Review of Systems   Review of Systems  Physical Exam Updated Vital Signs BP (!) 89/57   Pulse (!) 58   Temp 98.7 F (37.1 C) (Oral)   Resp 17   Ht 5' (1.524 m)   Wt 58.1 kg   LMP  (LMP Unknown)   SpO2  94%   BMI 25.00 kg/m  Physical Exam Constitutional:      General: She is not in acute distress. HENT:     Head: Normocephalic and atraumatic.  Eyes:     Conjunctiva/sclera: Conjunctivae normal.     Pupils: Pupils are equal, round, and reactive to light.  Cardiovascular:     Rate and Rhythm: Normal rate and regular rhythm.  Pulmonary:     Effort: Pulmonary effort is normal. No respiratory distress.  Abdominal:     General: There is no distension.     Tenderness: There is no abdominal tenderness.  Skin:    General: Skin is warm and dry.  Neurological:     General: No focal deficit present.     Mental Status: She is alert. Mental status is at baseline.  Psychiatric:        Mood and Affect: Mood normal.        Behavior: Behavior normal.     ED Results / Procedures / Treatments   Labs (all labs ordered are listed, but only abnormal results are displayed) Labs Reviewed  BASIC METABOLIC PANEL - Abnormal; Notable for the following components:      Result Value   Glucose, Bld 111 (*)    Calcium 8.1 (*)    All other components within normal limits  CBC WITH DIFFERENTIAL/PLATELET - Abnormal; Notable for the following components:   WBC 12.6 (*)    RBC 3.13 (*)    Hemoglobin 9.7 (*)    HCT 30.0 (*)    Neutro Abs 9.5 (*)    Monocytes Absolute 1.4 (*)    All other components within normal limits  D-DIMER, QUANTITATIVE - Abnormal; Notable for the following components:   D-Dimer, Quant 1.32 (*)    All other components within normal limits  BRAIN NATRIURETIC PEPTIDE - Abnormal; Notable for the following components:   B Natriuretic Peptide 411.5 (*)    All other components within normal limits  TROPONIN I (HIGH SENSITIVITY) - Abnormal; Notable for the following components:   Troponin I (High Sensitivity) 35 (*)    All other components within normal limits  TROPONIN I (HIGH SENSITIVITY) - Abnormal; Notable for the following components:   Troponin I (High Sensitivity) 28 (*)     All other components within normal limits    EKG EKG Interpretation  Date/Time:  Thursday Aug 22 2022 14:25:28 EDT Ventricular Rate:  62 PR Interval:  182 QRS Duration: 83 QT Interval:  445 QTC Calculation: 452 R Axis:   33 Text Interpretation: Sinus rhythm Low voltage, precordial leads Confirmed by Alvester Chou [phone]) on 08/22/2022 4:18:21 PM  Radiology CT Angio Chest PE W and/or Wo Contrast  Result Date: 08/22/2022 CLINICAL DATA:  Pulmonary embolism (PE) suspected, high prob EXAM:  CT ANGIOGRAPHY CHEST WITH CONTRAST TECHNIQUE: Multidetector CT imaging of the chest was performed using the standard protocol during bolus administration of intravenous contrast. Multiplanar CT image reconstructions and MIPs were obtained to evaluate the vascular anatomy. RADIATION DOSE REDUCTION: This exam was performed according to the departmental dose-optimization program which includes automated exposure control, adjustment of the mA and/or kV according to patient size and/or use of iterative reconstruction technique. CONTRAST:  75mL OMNIPAQUE IOHEXOL 350 MG/ML SOLN COMPARISON:  None Available. FINDINGS: Cardiovascular: No filling defects in the pulmonary arteries to suggest pulmonary emboli. Heart is normal size. Aorta is normal caliber. Mediastinum/Nodes: No mediastinal, hilar, or axillary adenopathy. Trachea and esophagus are unremarkable. Thyroid unremarkable. Lungs/Pleura: Trace bilateral pleural effusions. Dependent atelectasis in the lower lobes. Upper Abdomen: No acute findings Musculoskeletal: Chest wall soft tissues are unremarkable. No acute bony abnormality. Review of the MIP images confirms the above findings. IMPRESSION: No evidence of pulmonary embolus. Trace bilateral pleural effusions, dependent atelectasis. Electronically Signed   By: Charlett Nose M.D.   On: 08/22/2022 15:43    Procedures Procedures    Medications Ordered in ED Medications  iohexol (OMNIPAQUE) 350 MG/ML injection 75 mL  (75 mLs Intravenous Contrast Given 08/22/22 1525)  sodium chloride 0.9 % bolus 500 mL (500 mLs Intravenous New Bag/Given 08/22/22 1656)    ED Course/ Medical Decision Making/ A&P Clinical Course as of 08/22/22 1706  Thu Aug 22, 2022  1322 Patient O2 saturation rapidly improves when she is sitting up, awake and talking to [place], from 88% on room air to 95% on room air. [MT]  1631 Pt reassessed, O2 90-92% when I come in room (she is sleeping), improves to 98% when she is awake.  Will attempt amb pulse ox [MT]  1701 Amb pulse ox normal.  Pt noted to have some drop in BP after ambulation.  Reports she feels she's been a bit dehydrated, not drank water today.  We'll give a fluid bolus and reassess her.  She is not otherwise lightheaded.  If her blood pressure stabilizes, she could still be discharged home.  I have called and discussed her workup with her husband Simonne Come and gave clear instructions in terms of reducing her pain and sedative medications at home, both orally as well as in written instructions.  We also discussed about follow-up with a cardiologist for this elevation of her BNP.   [MT]  1702 Pt is still wanting to go home, and at this point as she has no new oxygen requirement, it would be reasonable, given the likelihood of oversedation as a cause of her ED presentation.   [MT]  1703 Patient is signed out to Dr Rodena Medin EDP pending reassessment of BP after IV fluid bolus.  At this time I do not see evidence of sepsis, PE, PNA, or other life threatening infection. [MT]    Clinical Course User Index [MT] Isamar Nazir, Kermit Balo, MD                             Medical Decision Making Amount and/or Complexity of Data Reviewed Labs: ordered. Radiology: ordered. ECG/medicine tests: ordered.  Risk Prescription drug management.   This patient presents to the ED with concern for shortness of breath. This involves an extensive number of treatment options, and is a complaint that carries with it a high risk  of complications and morbidity.  The differential diagnosis includes narcotic induced hypopnea most likely, given the patient's new narcotic  prescriptions, versus pneumonia, versus pulmonary embolism, versus pleural effusion, versus other  Co-morbidities that complicate the patient evaluation: Recent hip operation at high risk of thromboembolic events  Additional history obtained from EMS  I ordered and personally interpreted labs.  The pertinent results include:  Trop 35 -> 28, BNP 411, Hgb 9.7, WBC 12.6, Ddime 1.32, BMP unremarkable  I ordered imaging studies including CT PE I independently visualized and interpreted imaging which showed trace pleural effusions, no cardiomegaly, no acute PE, no pneumonia I agree with the radiologist interpretation  The patient was maintained on a cardiac monitor.  I personally viewed and interpreted the cardiac monitored which showed an underlying rhythm of: NSR  Per my interpretation the patient's ECG shows no acute ischemic findings  I ordered medication including IV fluid bolus for mild hypotension  I have reviewed the patients home medicines and have made adjustments as needed  After the interventions noted above, I reevaluated the patient and found that they have: improved           Final Clinical Impression(s) / ED Diagnoses Final diagnoses:  Shortness of breath  Pleural effusion    Rx / DC Orders ED Discharge Orders     None         Terald Sleeper, MD 08/22/22 1706

## 2022-08-22 NOTE — Discharge Instructions (Addendum)
Instructions  You were seen in the ER for low oxygen levels and excessive sleepiness at home.  I suspect this is due to your pain medications prescribed from your operation.  You are likely taking too high of a dose of these medications.  I recommend that you take the Norco (vicodin tablet) at 5 mg once every 6 hours, at a maximum.  Do not take 2 tablets at once.  Do not take Tizanadine (Zanaflex) within 6 hours of taking Norco.  Zanaflex is a muscle relaxer, and mixing this with opioid medications can slow down your breathing.  I would also recommend that you follow-up with a cardiologist for possible signs of fluid on the lungs or early heart failure.  I placed a referral to a cardiology group locally, and your husband's request.  They should contact you within 3 business days.  If you do not hear from them please call to schedule a follow-up appointment.  You may need additional testing, or an echocardiogram or an image of your heart to be performed as an outpatient.  If you have worsening dizziness, confusion, fevers, chills, lethargy, difficulty breathing, or any other emergency medical concerns, please return immediately to the emergency department.  *  Please monitor your blood pressure at home. Your blood pressure was slightly lower than normal today, but improved with fluids. Continue drinking water to stay hydrated.  Please contact your primary care provider's office for a follow up appointment.

## 2022-08-24 NOTE — Progress Notes (Unsigned)
Cardiology Office Note:    Date:  08/27/2022   ID:  Stephanie Kim, DOB Jul 19, 1950, MRN [id number]  PCP:  Lucila Maine   [facility] Providers Cardiologist:  None     Referring MD: Teena Irani, PA-C   Chief Complaint  Patient presents with   Shortness of Breath    History of Present Illness:    Stephanie Kim is a 72 y.o. female is seen at the request of Kathrin Ruddy MD  for evaluation of elevated BNP level. She has a history of palpitations, GERD, HLD, and HTN. She had normal PFTs in Feb 2024. She had a chronic cough related to lisinopril. She has a strong family history of premature CAD.   She underwent right THR one week ago by Dr Jerl Santos. She noted when she went home she noted her speech would tail off when talking. Then 5 days ago her therapist noted her oxygen sat was 82%. She did feel a little SOB. She went to the ED. BNP level was elevated. Troponin 35>>28. CT negative for PE. Oxygen levels in the ED were normal. Hgb was 9.7. she had a CT chest showing no PE and trace effusions. She is seen today with her husband. Notes today is the best day she has had yet. Denies SOB or CP currently. No edema. She does note when she lies down sometimes her heart rate feels fast  Past Medical History:  Diagnosis Date   Anxiety    Arthritis    Degenerative disc disease, cervical    Degenerative disc disease, lumbar    Depression    Dysrhythmia    palpitations according to her PA  Just comes and goes-placed on Metoprolo   Fibromyalgia    GERD (gastroesophageal reflux disease)    Headache    migraine  - last one was this past week   High cholesterol    High cholesterol    Hypertension    TMJ (dislocation of temporomandibular joint)    POPS MAINLY    Past Surgical History:  Procedure Laterality Date   arm surgery Right    BACK SURGERY     x 4 times   BUNIONECTOMY     left foot.    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   apixaban (ELIQUIS) 2.5 MG TABS tablet Take 1 tablet (2.5 mg total) by mouth 2 (two) times daily. For DVT prevention after surgery   atorvastatin (LIPITOR) 10 MG tablet Take 10 mg by mouth at bedtime.    buPROPion (WELLBUTRIN XL) 150 MG 24 hr tablet Take 150 mg by mouth in the morning.   busPIRone (BUSPAR) 7.5 MG tablet Take 7.5 mg by mouth 2 (two) times daily.  Calcium Carbonate-Vitamin D (CALCIUM-VITAMIN D) 600-125 MG-UNIT TABS Take 1 tablet by mouth 2 (two) times daily.    carvedilol (COREG) 6.25 MG tablet TAKE 1 AND 1/2 TABLETS(9.375 MG) BY MOUTH TWICE DAILY (Patient taking differently: Take 6.25 mg by mouth in the morning and at bedtime.)   CASCARA SAGRADA PO Take 2 capsules by mouth at bedtime. [employer] Herbal Laxative   cycloSPORINE (RESTASIS) 0.05 % ophthalmic emulsion Place 2 drops into both eyes 2 (two) times daily.   diclofenac Sodium (VOLTAREN) 1 % GEL Apply 2 g topically 4 (four) times daily.   fluticasone (FLONASE) 50 MCG/ACT nasal spray Place 2 sprays into both nostrils every evening.   HYDROcodone-acetaminophen (NORCO/VICODIN) 5-325 MG tablet Take 1-2 tablets by mouth every 6 (six) hours as needed for moderate pain (post op pain).   ipratropium (ATROVENT) 0.06 % nasal spray Place 2 sprays into both nostrils daily.   losartan (COZAAR) 50 MG  tablet Take 50 mg by mouth in the morning.   pantoprazole (PROTONIX) 40 MG tablet Take 40 mg by mouth in the morning.   sertraline (ZOLOFT) 100 MG tablet Take 100 mg by mouth in the morning.   tiZANidine (ZANAFLEX) 4 MG tablet Take 1 tablet (4 mg total) by mouth every 6 (six) hours as needed for muscle spasms.   valACYclovir (VALTREX) 500 MG tablet Take 500 mg by mouth daily as needed (cold sores.).     Allergies:   Atenolol, Oxycodone, Alendronate sodium, Aspirin, Levofloxacin, Moxifloxacin, Meloxicam, and Amoxicillin-pot clavulanate   Social History   Socioeconomic History   Marital status: Married    Spouse name: Not on file   Number of children: 0   Years of education: Not on file   Highest education level: Not on file  Occupational History   Not on file  Tobacco Use   Smoking status: Never   Smokeless tobacco: Never  Vaping Use   Vaping Use: Never used  Substance and Sexual Activity   Alcohol use: Yes    Alcohol/week: 10.0 standard drinks of alcohol    Types: 7 Glasses of wine, 3 Shots of liquor per week    Comment: weekly   Drug use: No   Sexual activity: Yes    Birth control/protection: Post-menopausal  Other Topics Concern   Not on file  Social History Narrative   Retired Architectural technologist.   Social Determinants of Health   Financial Resource Strain: Not on file  Food Insecurity: No Food Insecurity (08/20/2022)   Hunger Vital Sign    Worried About Running Out of Food in the Last Year: Never true    Ran Out of Food in the Last Year: Never true  Transportation Needs: No Transportation Needs (08/20/2022)   PRAPARE - Administrator, Civil Service (Medical): No    Lack of Transportation (Non-Medical): No  Physical Activity: Not on file  Stress: Not on file  Social Connections: Not on file     Family History: The patient's family history includes Arthritis in her mother; COPD in her mother; Heart attack in her brother and father; Hyperlipidemia in  her brother, father, and mother; Hypertension in her brother and mother.  ROS:   Please see the history of present illness.     All other systems reviewed and are negative.  EKGs/Labs/Other Studies Reviewed:    The following studies were reviewed today: CT ANGIOGRAPHY CHEST WITH CONTRAST   TECHNIQUE: Multidetector CT imaging of the chest was performed using the standard protocol during bolus  administration of intravenous contrast. Multiplanar CT image reconstructions and MIPs were obtained to evaluate the vascular anatomy.   RADIATION DOSE REDUCTION: This exam was performed according to the departmental dose-optimization program which includes automated exposure control, adjustment of the mA and/or kV according to patient size and/or use of iterative reconstruction technique.   CONTRAST:  75mL OMNIPAQUE IOHEXOL 350 MG/ML SOLN   COMPARISON:  None Available.   FINDINGS: Cardiovascular: No filling defects in the pulmonary arteries to suggest pulmonary emboli. Heart is normal size. Aorta is normal caliber.   Mediastinum/Nodes: No mediastinal, hilar, or axillary adenopathy. Trachea and esophagus are unremarkable. Thyroid unremarkable.   Lungs/Pleura: Trace bilateral pleural effusions. Dependent atelectasis in the lower lobes.   Upper Abdomen: No acute findings   Musculoskeletal: Chest wall soft tissues are unremarkable. No acute bony abnormality.   Review of the MIP images confirms the above findings.   IMPRESSION: No evidence of pulmonary embolus.   Trace bilateral pleural effusions, dependent atelectasis.     Electronically Signed   By: Charlett Nose M.D.   On: 08/22/2022 15:43    EKG:  EKG is  ordered today.  The ekg ordered today demonstrates NSR rate 68. Normal. I have personally reviewed and interpreted this study.   Recent Labs: 08/22/2022: B Natriuretic Peptide 411.5; BUN 12; Creatinine, Ser 0.75; Hemoglobin 9.7; Platelets 208; Potassium 3.7; Sodium 137   Recent Lipid Panel No results found for: "CHOL", "TRIG", "HDL", "CHOLHDL", "VLDL", "LDLCALC", "LDLDIRECT"   Risk Assessment/Calculations:                Physical Exam:    VS:  BP 110/60 (BP Location: Left Arm, Patient Position: Sitting, Cuff Size: Normal)   Pulse 68   Ht 5' (1.524 m)   Wt 130 lb (59 kg)   LMP  (LMP Unknown)   SpO2 96%   BMI 25.39 kg/m     Wt Readings from Last 3 Encounters:  08/27/22 130 lb (59 kg)  08/22/22 128 lb (58.1 kg)  08/20/22 130 lb (59 kg)     GEN:  Well nourished, well developed in no acute distress HEENT: Normal NECK: No JVD; No carotid bruits LYMPHATICS: No lymphadenopathy CARDIAC: RRR, no murmurs, rubs, gallops RESPIRATORY:  Clear to auscultation without rales, wheezing or rhonchi  ABDOMEN: Soft, non-tender, non-distended MUSCULOSKELETAL:  No edema; No deformity  SKIN: Warm and dry NEUROLOGIC:  Alert and oriented x 3 PSYCHIATRIC:  Normal affect   ASSESSMENT:    1. Shortness of breath   2. Elevated brain natriuretic peptide (BNP) level    PLAN:    In order of problems listed above:  Patient had recent hypoxia following THR. CT negative for PE. BNP elevated 411 but scant evidence of CHF. She is feeling much better now. Recommend Echo to assess LV function. Will schedule. I have recommended she take her pulse with pulse ox if she feels her HR is up.  HTN controlled HLD on statin Family history of premature CAD           Medication Adjustments/Labs and Tests Ordered: Current medicines are reviewed at length with the patient today.  Concerns regarding medicines are outlined above.  No orders of the defined types were placed in this encounter.  No orders of the defined types were placed in this encounter.   There are no Patient Instructions on file for this visit.   Signed, Kenyon Eichelberger [place], MD  08/27/2022 3:42 PM    [facility]

## 2022-08-27 ENCOUNTER — Encounter: Payer: Self-pay | Admitting: Cardiology

## 2022-08-27 ENCOUNTER — Ambulatory Visit: Payer: Medicare Other | Attending: Cardiology | Admitting: Cardiology

## 2022-08-27 VITALS — BP 110/60 | HR 68 | Ht 60.0 in | Wt 130.0 lb

## 2022-08-27 DIAGNOSIS — R0602 Shortness of breath: Secondary | ICD-10-CM

## 2022-08-27 DIAGNOSIS — R7989 Other specified abnormal findings of blood chemistry: Secondary | ICD-10-CM | POA: Diagnosis not present

## 2022-08-27 NOTE — Patient Instructions (Signed)
Medication Instructions:  Continue same medications   Lab Work: None ordered   Testing/Procedures: Echo   Follow-Up: At [facility], you and your health needs are our priority.  As part of our continuing mission to provide you with exceptional heart care, we have created designated Provider Care Teams.  These Care Teams include your primary Cardiologist (physician) and Advanced Practice Providers (APPs -  Physician Assistants and Nurse Practitioners) who all work together to provide you with the care you need, when you need it.  We recommend signing up for the patient portal called "MyChart".  Sign up information is provided on this After Visit Summary.  MyChart is used to connect with patients for Virtual Visits (Telemedicine).  Patients are able to view lab/test results, encounter notes, upcoming appointments, etc.  Non-urgent messages can be sent to your provider as well.   To learn more about what you can do with MyChart, go to [email or web address].    Your next appointment:  To Be Determined After Echo    Provider:  Dr.Jordan

## 2022-09-09 ENCOUNTER — Telehealth: Payer: Self-pay | Admitting: Cardiology

## 2022-09-09 NOTE — Telephone Encounter (Signed)
Spoke to patient Dr.Jordan's advice given. 

## 2022-09-09 NOTE — Telephone Encounter (Signed)
Patient is requesting call back to discuss further information concerning upcoming test.

## 2022-09-09 NOTE — Telephone Encounter (Signed)
Spoke  to patient. She  wanted to know if she could  have  carotid doppler done as well. She states her family has history of heart disease. She forgot to ask Dr [place]  about ordering this for her.  RN asked patient if Dr [place] placed a stethoscope to her neck during  office visit. She stated ,yes.   RN explained to patient. He was checking to see if he heard any abnormal sound within the arteries, if he did not comment then every thing sounded good.  You would need a diagnosis to cover or there is screening patient can pay out of pocket.   Patient wanted to know if Dr [place] thought she need one.  Patient aware wil defer to Dr [place] for review.

## 2022-10-01 ENCOUNTER — Ambulatory Visit (INDEPENDENT_AMBULATORY_CARE_PROVIDER_SITE_OTHER): Payer: Medicare Other

## 2022-10-01 DIAGNOSIS — R7989 Other specified abnormal findings of blood chemistry: Secondary | ICD-10-CM

## 2022-10-01 DIAGNOSIS — R0602 Shortness of breath: Secondary | ICD-10-CM

## 2022-10-01 LAB — ECHOCARDIOGRAM COMPLETE
Area-P 1/2: 3.03 cm2
S' Lateral: 2.39 cm

## 2022-10-02 ENCOUNTER — Telehealth: Payer: Self-pay

## 2022-10-02 NOTE — Telephone Encounter (Signed)
Spoke to patient Dr.Jordan's advice given.She wanted to ask Dr.Jordan if she needs to have echo repeated in a couple of years.I will send message to him.

## 2022-10-02 NOTE — Telephone Encounter (Signed)
Spoke to patient echo results given.She saw results on mychart and noticed she has mild calcification of aortic valve.She wanted to know if ok to continue calcium supplement she takes 1200 mg daily.I will send message to Dr.Jordan.

## 2022-10-02 NOTE — Telephone Encounter (Signed)
Spoke to patient Dr.Jordan's advice given.She was appreciative for the call.

## 2023-05-27 ENCOUNTER — Other Ambulatory Visit (HOSPITAL_BASED_OUTPATIENT_CLINIC_OR_DEPARTMENT_OTHER): Payer: Self-pay | Admitting: Pulmonary Disease

## 2023-06-14 ENCOUNTER — Other Ambulatory Visit (HOSPITAL_BASED_OUTPATIENT_CLINIC_OR_DEPARTMENT_OTHER): Payer: Self-pay | Admitting: Pulmonary Disease
